# Patient Record
Sex: Male | Born: 1940 | Race: White | Hispanic: No | Marital: Married | Smoking: Never smoker
Health system: Southern US, Community
[De-identification: ages and names within clinical notes are randomized; demographics above are authoritative.]

## PROBLEM LIST (undated history)

## (undated) DIAGNOSIS — C801 Malignant (primary) neoplasm, unspecified: Secondary | ICD-10-CM

## (undated) DIAGNOSIS — I10 Essential (primary) hypertension: Secondary | ICD-10-CM

## (undated) DIAGNOSIS — F419 Anxiety disorder, unspecified: Secondary | ICD-10-CM

## (undated) DIAGNOSIS — M1990 Unspecified osteoarthritis, unspecified site: Secondary | ICD-10-CM

## (undated) DIAGNOSIS — K219 Gastro-esophageal reflux disease without esophagitis: Secondary | ICD-10-CM

## (undated) HISTORY — PX: HERNIA REPAIR: SHX51

## (undated) HISTORY — PX: SEPTOPLASTY: SUR1290

## (undated) HISTORY — PX: KNEE SURGERY: SHX244

## (undated) HISTORY — PX: NECK SURGERY: SHX720

## (undated) HISTORY — PX: TONSILLECTOMY: SUR1361

## (undated) HISTORY — PX: ROTATOR CUFF REPAIR: SHX139

---

## 2003-09-03 ENCOUNTER — Ambulatory Visit (HOSPITAL_COMMUNITY): Admission: RE | Admit: 2003-09-03 | Discharge: 2003-09-03 | Payer: Self-pay | Admitting: Gastroenterology

## 2003-09-03 ENCOUNTER — Encounter (INDEPENDENT_AMBULATORY_CARE_PROVIDER_SITE_OTHER): Payer: Self-pay | Admitting: Specialist

## 2006-01-03 ENCOUNTER — Encounter: Admission: RE | Admit: 2006-01-03 | Discharge: 2006-01-03 | Payer: Self-pay | Admitting: Internal Medicine

## 2006-09-02 ENCOUNTER — Emergency Department (HOSPITAL_COMMUNITY): Admission: EM | Admit: 2006-09-02 | Discharge: 2006-09-02 | Payer: Self-pay | Admitting: Emergency Medicine

## 2007-04-10 ENCOUNTER — Ambulatory Visit (HOSPITAL_COMMUNITY): Admission: RE | Admit: 2007-04-10 | Discharge: 2007-04-10 | Payer: Self-pay | Admitting: Internal Medicine

## 2007-10-16 ENCOUNTER — Inpatient Hospital Stay (HOSPITAL_COMMUNITY): Admission: RE | Admit: 2007-10-16 | Discharge: 2007-10-19 | Payer: Self-pay | Admitting: Orthopedic Surgery

## 2008-07-19 ENCOUNTER — Encounter: Admission: RE | Admit: 2008-07-19 | Discharge: 2008-07-19 | Payer: Self-pay | Admitting: Internal Medicine

## 2009-01-16 IMAGING — CR DG CHEST 2V
2 series · 2 of 2 positions shown · non-contrast
Comparison: None.

CLINICAL DATA: Preoperative respiratory film.
 CHEST - 2 VIEW:

[w chest pa *]
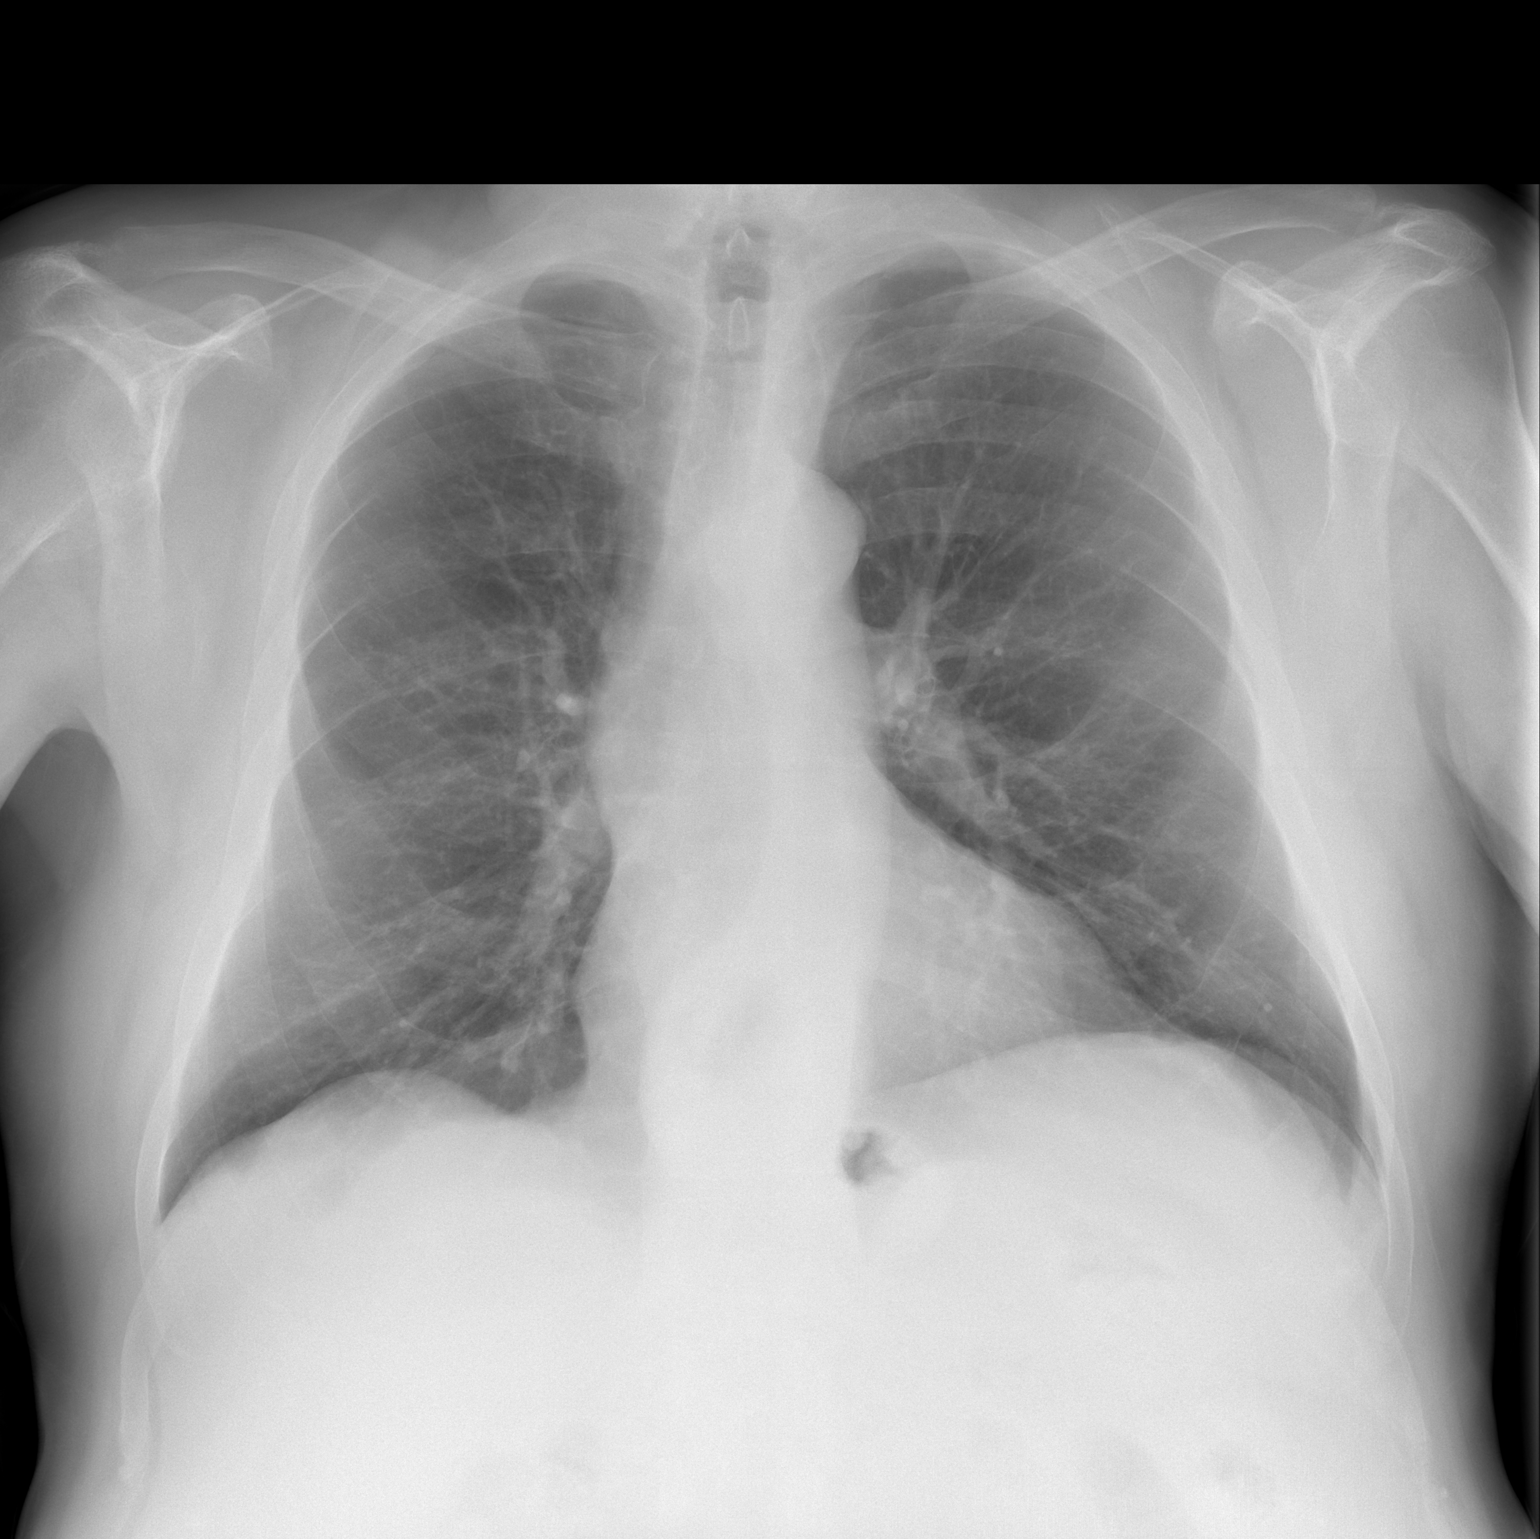

[w chest lat]
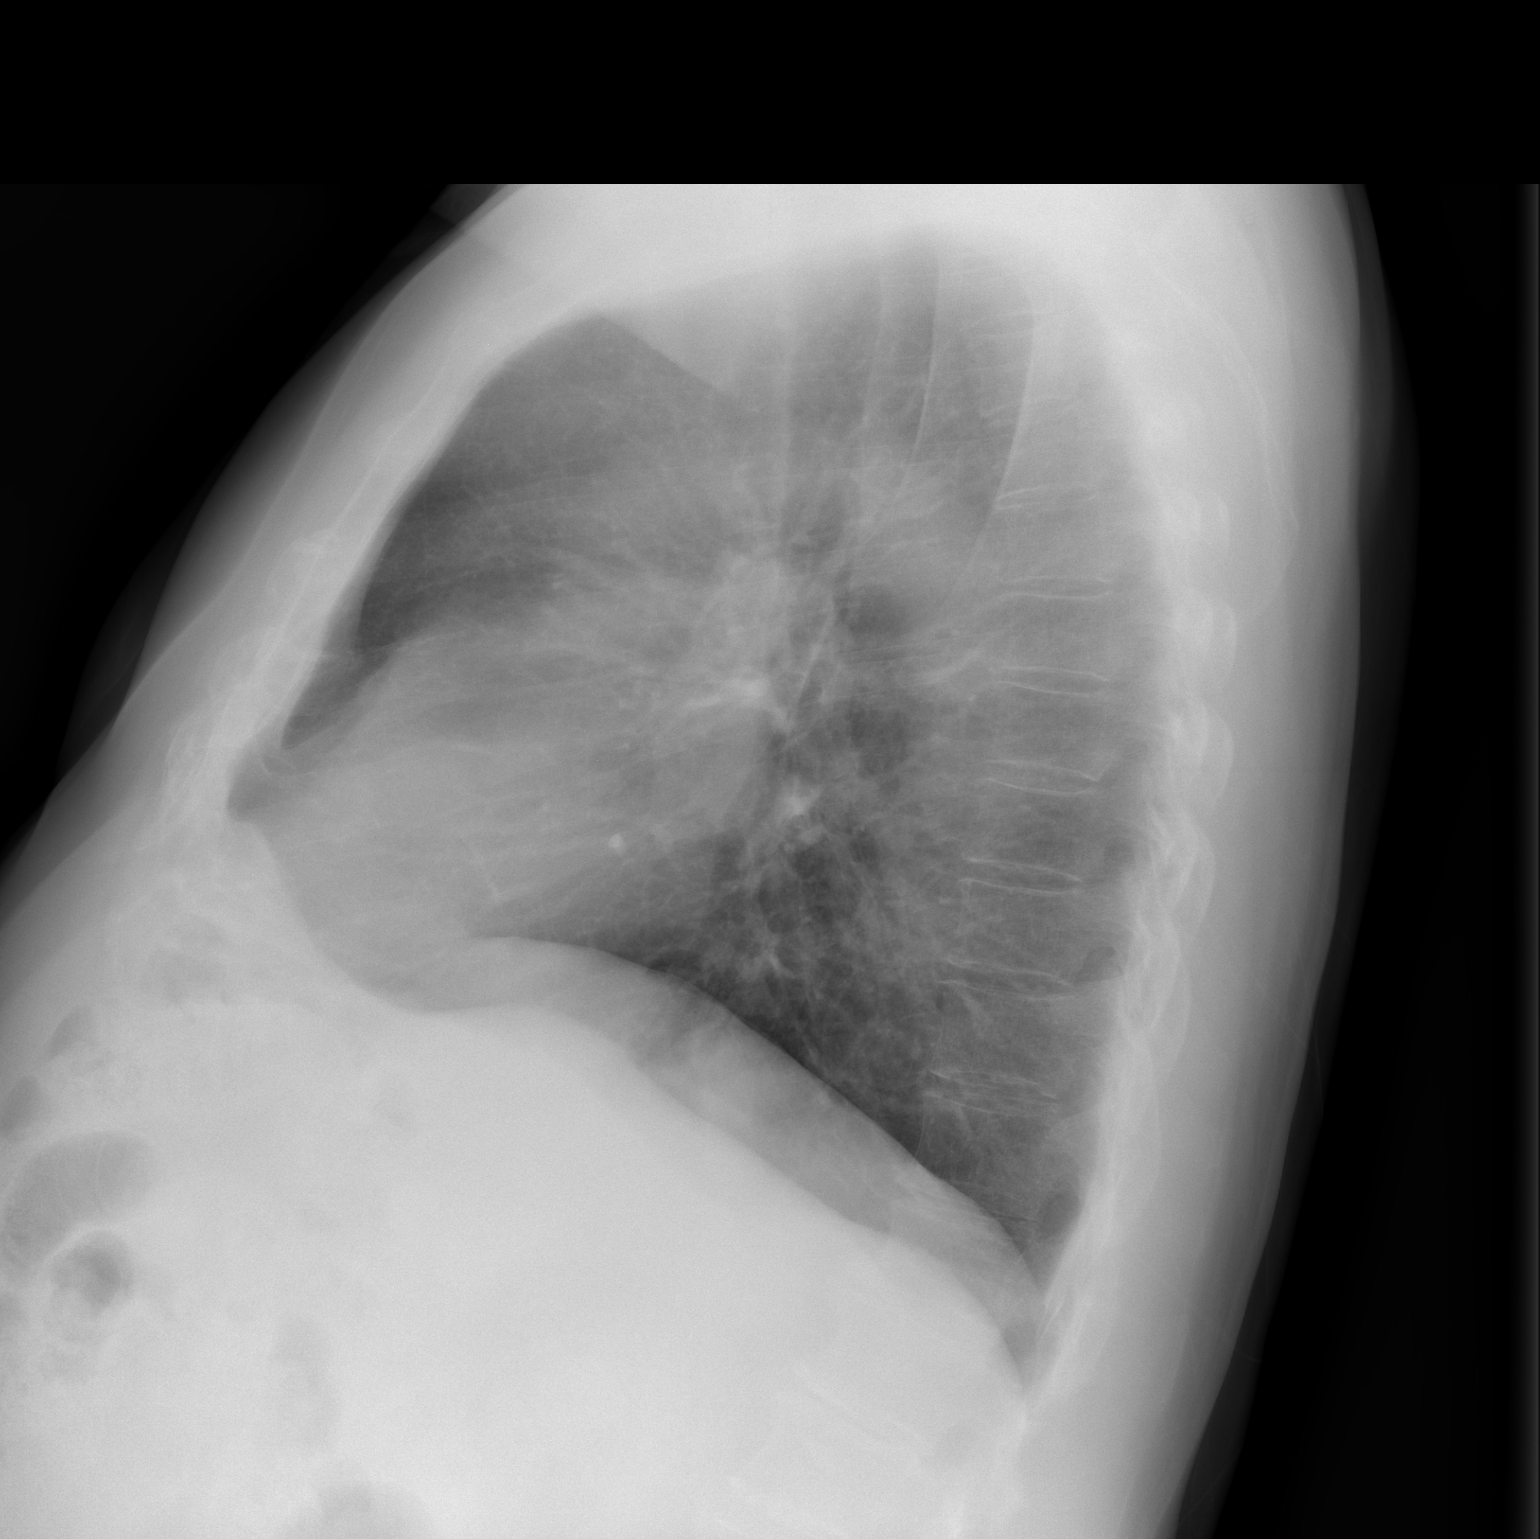

[2 of 2 positions shown; findings below may reference images not displayed]

FINDINGS: The lungs are clear. Heart size is normal. No pleural effusion or focal bony abnormality.
IMPRESSION: No acute disease.

## 2009-12-13 ENCOUNTER — Encounter: Admission: RE | Admit: 2009-12-13 | Discharge: 2009-12-13 | Payer: Self-pay | Admitting: Internal Medicine

## 2010-01-31 ENCOUNTER — Encounter: Admission: RE | Admit: 2010-01-31 | Discharge: 2010-01-31 | Payer: Self-pay | Admitting: Internal Medicine

## 2010-08-08 ENCOUNTER — Encounter: Admission: RE | Admit: 2010-08-08 | Discharge: 2010-08-08 | Payer: Self-pay | Admitting: Internal Medicine

## 2011-02-05 ENCOUNTER — Other Ambulatory Visit: Payer: Self-pay | Admitting: Internal Medicine

## 2011-02-08 ENCOUNTER — Ambulatory Visit
Admission: RE | Admit: 2011-02-08 | Discharge: 2011-02-08 | Disposition: A | Discharge status: Home / Self Care | Payer: Medicare Other | Source: Ambulatory Visit | Attending: Internal Medicine | Admitting: Internal Medicine

## 2011-02-09 ENCOUNTER — Other Ambulatory Visit: Payer: Self-pay

## 2011-02-21 ENCOUNTER — Ambulatory Visit
Admission: RE | Admit: 2011-02-21 | Discharge: 2011-02-21 | Disposition: A | Discharge status: Home / Self Care | Payer: Medicare Other | Source: Ambulatory Visit | Attending: Family Medicine | Admitting: Family Medicine

## 2011-02-21 ENCOUNTER — Other Ambulatory Visit: Payer: Self-pay | Admitting: Family Medicine

## 2011-02-21 DIAGNOSIS — J329 Chronic sinusitis, unspecified: Secondary | ICD-10-CM

## 2011-05-08 NOTE — Op Note (Signed)
NAME:  Nicholas Stuart, Nicholas Stuart NO.:  [id number]   MEDICAL RECORD NO.:  [id number]          PATIENT TYPE:  INP   LOCATION:  0003                         FACILITY:  [facility]   PHYSICIAN:  Madlyn Frankel. Charlann Boxer, M.D.  DATE OF BIRTH:  Feb 04, 1941   DATE OF PROCEDURE:  10/16/2007  DATE OF DISCHARGE:                               OPERATIVE REPORT   PREOPERATIVE DIAGNOSIS:  Left knee osteoarthritis.   POSTOPERATIVE DIAGNOSIS:  Left knee osteoarthritis.   PROCEDURE:  Left total knee replacement.   COMPONENTS USED:  DePuy rotating platform, posterior stabilized knee  system, size 5 femur, 4 tibia, 10 mm x 5 insert, a 38 patellar button.   SURGEON:  Madlyn Frankel. Charlann Boxer, M.D.   ASSISTANT:  Yetta Glassman. Mann, PA.   ANESTHESIA:  General plus a postoperative femoral nerve block ordered.   DRAIN:  One.   TOURNIQUET TIME:  47 minutes 250 mmHg.   COMPLICATIONS:  None.   INDICATIONS FOR PROCEDURE:  Nicholas Stuart is a 70 year old gentleman who I  have been following conservatively for left knee osteoarthritis and a  worker's comp plan.  He had failed all conservative measures including  arthroscopic surgery, injections, viscus augmentation.  Given the  failure, persistent discomfort, inability to do his normal work as well  as a decreased quality life, we discussed knee replacement options.  Risks and benefits were discussed and consent obtained for left total  knee replacement.   PROCEDURE IN DETAIL:  The patient was brought to operative theater.  Once adequate anesthesia and preoperative antibiotics, Ancef,  administered, the patient was positioned in the supine position with a  left proximal thigh tourniquet placed.  The left lower extremity was  prescrubbed and prepped and draped in sterile fashion.  Midline incision  was made followed by median parapatellar arthrotomy with patella  subluxation rather than eversion.  Following the initial debridement of  partial meniscectomies, attention  was first directed to the patella,  precut measure was 25 mm.  I resected down to 14 mm and used a 38  patella button.  A metal shim was placed, I did debride some of the  lateral facet to make sure there was no bony contact.  At this point,  attention was directed to the femur.  The femoral canal was opened with  a drill, irrigated to prevent fat emboli.  Intramedullary rod was passed  and at 5 degrees of valgus I resected 10 mm of bone off the distal  femur.  I sized the femur in AP dimension to be a size 5, even though in  the medial and lateral dimension it may have been closer to 4.  There  was no way to make this a size 4 without notching the femur or elevating  the joint line and potentially destabilizing my flexion gap.  The  anterior, posterior and chamfer cuts were then made off the posterior  condylar axis which matched that of the epicondylar axis and was  perpendicular to Whiteside's line.  Following this, the final box cut  was made up the distal aspect a little lateral femur.  At this point,  attention was directed now to the tibia with tibia subluxation.  I was  able to remove the remaining meniscus, PCL stump.  With the  extramedullary rod I resected 10 mm of bone off the lateral proximal  tibia into the more normal side.  Following this, I sized the tibia cut  to be a size 4.  I checked with an alignment rod and was happy that my  cut was perpendicular in both planes through the center of the ankle.  I  went ahead and drilled and keel punched and then performed a trial  reduction with a size 5 femur, 4 tibia and a 10-mm insert.  The knee  came out to full extension and was stable from extension to flexion.  The patella tracked through the trochlea without application of pressure  and with no some subluxation laterally or tilting.  At this point, all  trial components were removed, the knee was irrigated with normal saline  solution.  I injected the knee with 25% Marcaine  with epinephrine and 1  mL of Toradol total 61 mL.  The cement was mixed on the back table.  Components were then cemented into position and the knee brought out to  extension with a trial 10 in place.  Extruded cement was removed.  Once  the cement had cured, the knee was brought to flexion and the remaining  cement was removed.  Once I was satisfied that I was unable to visualize  any further cement debris, I placed a final 10-mm insert, size 5 to  match the femur, 5 mL of FloSeal was injected in the posterior capsule  tissues medially and laterally.   The knee was brought to extension.  The medium Hemovac drain was placed  deep.  The knee was then brought to flexion and closes the extensor  mechanism with #1 Vicryl in flexion.  The remainder of the  wound was  closed 2-0 Vicryl and running 4-0 Monocryl.  The knee was cleaned, dried  and dressed sterilely with the sterile and Steri-Strips and a bulky  sterile wrap.  He was brought to the recovery room extubated in stable  condition.      Madlyn Frankel Charlann Boxer, M.D.  Electronically Signed     MDO/MEDQ  D:  10/16/2007  T:  10/17/2007  Job:  [id number]

## 2011-05-08 NOTE — H&P (Signed)
NAME:  Nicholas Stuart, ADVANI NO.:  [id number]   MEDICAL RECORD NO.:  [id number]          PATIENT TYPE:  INP   LOCATION:  NA                           FACILITY:  [facility]   PHYSICIAN:  Madlyn Frankel. Charlann Boxer, M.D.  DATE OF BIRTH:  [birth year]   DATE OF ADMISSION:  10/16/2007  DATE OF DISCHARGE:                              HISTORY & PHYSICAL   PROCEDURE:  Left total knee arthroplasty.   CHIEF COMPLAINT:  Left knee pain.   HISTORY OF PRESENT ILLNESS:  A 70 year old male with a history of  persistent and progressive left knee pain secondary to osteoarthritis.  It has been refractory to all conservative treatment including oral anti-  inflammatories, cortisone injection, Visco series injections, and  arthroscopic debridement.  He has diminished quality of life,  intractable pain.  He is scheduled for left total knee replacement.  He  is to be presurgically assessed by Dr. Nila Nephew.   PAST MEDICAL HISTORY:  Significant for:  1. Osteoarthritis.  2. Hypertension.  3. Reflux disease.  4. Migraine headaches.   PAST SURGICAL HISTORY:  Includes:  1. Right knee surgery in 1983.  2. Bunion removal in 1985.  3. Left knee scope in 2005.   FAMILY HISTORY:  None.   SOCIAL HISTORY:  Married.  Primary caregiver after surgery will be  spouse.   DRUG ALLERGIES:  SULFA DRUGS.   MEDICATIONS:  1. Finasteride 5 mg one p.o. daily.  2. Bupropion XL 300 mg one p.o. daily.  3. Flomax 0.4 mg p.o. daily.  4. Hydrochlorothiazide 25 mg one p.o. daily.  5. Lisinopril 40 mg one p.o. daily.  6. Prednisone 10 mg one p.o. daily.   REVIEW OF SYSTEMS:  GASTROINTESTINAL:  History of peptic ulcers.  GENITOURINARY:  Has some urinary frequency and trouble with excessive  urinating at night.  ORTHOPEDIC:  Has taken prednisone for his arthritis  in the past.  Otherwise, see HPI.   PHYSICAL EXAMINATION:  Pulse 76, respirations 18, blood pressure 116/64.  GENERAL:  This is an awake, alert and oriented,  well-developed, well-  nourished male in no acute distress.  NECK:  Supple.  No carotid bruits.  CHEST:  Lungs clear to auscultation bilaterally.  BREASTS:  Deferred.  HEART:  Regular rate and rhythm without gallops, clicks, rubs or  murmurs.  ABDOMEN:  Soft, nontender, nondistended.  Bowel sounds present.  GENITOURINARY:  Deferred.  EXTREMITIES:  Left knee comes out to full extension with flexion to 120  degrees plus.  SKIN:  No cellulitis.  NEUROLOGIC:  Intact distal sensibilities.   LABORATORIES:  EKG, chest x-ray pending presurgical testing at [facility].   IMPRESSION:  1. Osteoarthritis.  2. Hypertension.  3. Gastroesophageal reflux disease.  4. Migraine headaches.   PLAN OF ACTION:  Left total knee arthroplasty by surgeon Dr. Durene Romans.  Risks and complications were discussed.   Postoperative medications including Lovenox, Robaxin, iron, aspirin,  Colace, MiraLax provided at time of history of physical.  Pain  medications will be provided at time of surgery.     ______________________________  Yetta Glassman [address], [place]  Madlyn Frankel Charlann Boxer, M.D.  Electronically Signed    BLM/MEDQ  D:  10/03/2007  T:  10/03/2007  Job:  [id number]   cc:   Erskine Speed, M.D.  Fax: [phone]

## 2011-05-11 NOTE — Op Note (Signed)
   NAME:  Nicholas Stuart, Nicholas Stuart NO.:  [id number]   MEDICAL RECORD NO.:  [id number]                   PATIENT TYPE:  AMB   LOCATION:  ENDO                                 FACILITY:  [facility]   PHYSICIAN:  John C. Madilyn Fireman, M.D.                 DATE OF BIRTH:  September 11, 1941   DATE OF PROCEDURE:  09/03/2003  DATE OF DISCHARGE:                                 OPERATIVE REPORT   OPERATION:  Colonoscopy with polypectomy.   INDICATIONS FOR PROCEDURE:  Colon cancer screening in a 70 year old patient  with no recent screening.   PROCEDURE:  The patient was placed in the left lateral decubitus position  and placed on the pulse monitor with continuous low flow oxygen delivered by  nasal cannula.  He was sedated with 80 mcg IV Fentanyl and 8 mg IV Versed.  The Olympus video colonoscope was inserted into the rectum and advanced to  the cecum, confirmed by transillumination of McBurney's point and  visualization of the ileocecal valve and appendiceal orifice.  The prep was  good.  The cecum, ascending, transverse, descending, and sigmoid colon all  appeared normal with no masses, polyps, diverticula or other mucosal  abnormalities.  In the rectum, there were two small polyps, approximately 6  and 8 mm in diameter, both of these were fulgurated by hot biopsy.  The  remainder of the rectum appeared normal.  The retroflex view of the anus  revealed no obvious internal hemorrhoids.  The scope was then withdrawn and  the patient returned to the recovery room in stable condition.  He tolerated  the procedure well and there were no immediate complications.   IMPRESSION:  Two small rectal polyps, otherwise, normal study.   PLAN:  Await histology to determine the method and intervals of future colon  screening.                                               John C. Madilyn Fireman, M.D.    JCH/MEDQ  D:  09/03/2003  T:  09/03/2003  Job:  [id number]   cc:   Erskine Speed, M.D.  [address],  [address]  [place]  [place] [address]  Fax: [phone]

## 2011-05-11 NOTE — Discharge Summary (Signed)
NAME:  Nicholas Stuart, Nicholas Stuart NO.:  [id number]   MEDICAL RECORD NO.:  [id number]          PATIENT TYPE:  INP   LOCATION:  1603                         FACILITY:  [facility]   PHYSICIAN:  Madlyn Frankel. Charlann Boxer, M.D.  DATE OF BIRTH:  12-18-1941   DATE OF ADMISSION:  10/16/2007  DATE OF DISCHARGE:  10/19/2007                               DISCHARGE SUMMARY   ADMITTING DIAGNOSES:  1. Osteoarthritis.  2. Hypertension.  3. Reflux disease.  4. Migraine headaches.   DISCHARGE DIAGNOSES:  1. Osteoarthritis.  2. Hypertension.  3. Reflux disease.  4. Migraine headaches.   CONSULTATION:  None.   PROCEDURE:  Right total knee replacement.   SURGEON:  Dr. Durene Romans.   ASSISTANT:  Dwyane Luo.   BRIEF HISTORY OF PRESENT ILLNESS:  A 70 year old male with a history of  persistent progressive left knee pain, secondary to osteoarthritis.  Refractory to all conservative treatments.   LABS:  Pre-admission hematocrit 39.3, remained stable, at discharge was  32.  Chemistries all within normal limits, at discharge all within  normal limits.  UA negative.  Chest X-Ray:  No active disease.  Coags  negative.   Cardiology:  EKG normal sinus rhythm from April 2008.   HOSPITAL COURSE:  The patient underwent a right total knee replacement,  tolerated procedure well, was admitted to orthopedic floor.  Remained  afebrile throughout his of course stay.  Dressing was changed on daily  basis after post-op day #1, no drainage from knee.  He was weightbearing  as tolerated with the use of rolling walker, per PT/OT.  DVT  prophylaxis, Lovenox, started on post-op day #1.  He progressed nicely  during his course of stay.  By day 3, he was stable, in improved  condition, ready for discharge.   DISCHARGE DISPOSITION:  Discharge stable, in improved condition.   DISCHARGE DIET:  No restrictions.   DISCHARGE WOUND CARE:  Keep dry, change dressing daily.   DISCHARGE PHYSICAL THERAPY:  Weightbearing as  tolerated with the use of  rolling walker for 2 weeks, then transition to single-point cane.   DISCHARGE FOLLOWUP:  Follow up with Dr. Charlann Boxer at phone number [phone],  in two weeks.   DISCHARGE MEDICATIONS:  1. Norco 5/325 one to two p.o. q.4-6 p.r.n. pain.  2. Lovenox 40 mg subcu q.24 x11 days.  3. Robaxin 500 mg p.o. daily q.6 p.r.n. muscle spasm pain.  4. Iron 325 mg p.o. t.i.d. x3 weeks.  5. Aspirin 325 mg one p.o. daily x4 weeks after Lovenox completed.  6. Colace 100 mg p.o. b.i.d.  7. MiraLax 17 grams p.o. daily.  8. Prednisone 10 mg p.o. q. with breakfast.  9. Caltrate 600 mg p.o. b.i.d.  10.Prilosec q.h.s.  11.Lisinopril 40 mg one p.o. q.h.s.  12.Bupropion XL 300 mg one p.o. q.h.s.  13.Flomax 0.4 mg one p.o. q.h.s.  14.HCTZ 25 mg one p.o. q.h.s..     ______________________________  Yetta Glassman [address], [place]      Madlyn Frankel. Charlann Boxer, M.D.  Electronically Signed    BLM/MEDQ  D:  11/04/2007  T:  11/04/2007  Job:  [id number]   cc:   Erskine Speed, M.D.  Fax: [phone]   (fax copy)

## 2011-10-03 LAB — COMPREHENSIVE METABOLIC PANEL
AST: 20
Alkaline Phosphatase: 34 — ABNORMAL LOW
BUN: 15
CO2: 29
Calcium: 9.5
Chloride: 100
Creatinine, Ser: 0.92
GFR calc Af Amer: >60
GFR calc non Af Amer: >60
Potassium: 3.9
Sodium: 137

## 2011-10-03 LAB — TYPE AND SCREEN: Antibody Screen: NEGATIVE

## 2011-10-03 LAB — BASIC METABOLIC PANEL
BUN: 11
BUN: 9
Calcium: 9
Chloride: 107
Creatinine, Ser: 0.92
GFR calc Af Amer: >60
GFR calc Af Amer: >60
GFR calc non Af Amer: >60
GFR calc non Af Amer: >60
Potassium: 3.6
Potassium: 4.3
Sodium: 142

## 2011-10-03 LAB — CBC
HCT: 32 — ABNORMAL LOW
HCT: 32.8 — ABNORMAL LOW
HCT: 39.3
Hemoglobin: 11.3 — ABNORMAL LOW
MCHC: 34.3
MCV: 91.6
Platelets: 193
RBC: 3.5 — ABNORMAL LOW
RDW: 13.7
WBC: 9

## 2011-10-03 LAB — URINALYSIS, ROUTINE W REFLEX MICROSCOPIC
Bilirubin Urine: NEGATIVE
Hgb urine dipstick: NEGATIVE
Specific Gravity, Urine: 1.016
pH: 6.5

## 2011-10-03 LAB — ABO/RH: ABO/RH(D): O POS

## 2011-10-03 LAB — APTT: aPTT: 25

## 2011-10-03 LAB — PROTIME-INR: INR: 1

## 2012-08-05 ENCOUNTER — Ambulatory Visit (HOSPITAL_COMMUNITY)
Admission: RE | Admit: 2012-08-05 | Discharge: 2012-08-05 | Disposition: A | Discharge status: Home / Self Care | Payer: Medicare Other | Source: Ambulatory Visit | Attending: Internal Medicine | Admitting: Internal Medicine

## 2012-08-05 DIAGNOSIS — R079 Chest pain, unspecified: Secondary | ICD-10-CM

## 2014-01-11 ENCOUNTER — Other Ambulatory Visit: Payer: Self-pay | Admitting: Obstetrics and Gynecology

## 2014-02-17 ENCOUNTER — Other Ambulatory Visit: Payer: Self-pay | Admitting: Internal Medicine

## 2014-02-17 DIAGNOSIS — M810 Age-related osteoporosis without current pathological fracture: Secondary | ICD-10-CM

## 2014-03-08 ENCOUNTER — Ambulatory Visit
Admission: RE | Admit: 2014-03-08 | Discharge: 2014-03-08 | Disposition: A | Discharge status: Home / Self Care | Payer: Medicare Other | Source: Ambulatory Visit | Attending: Internal Medicine | Admitting: Internal Medicine

## 2014-03-08 DIAGNOSIS — M810 Age-related osteoporosis without current pathological fracture: Secondary | ICD-10-CM

## 2014-03-25 ENCOUNTER — Ambulatory Visit: Payer: Self-pay

## 2014-03-25 ENCOUNTER — Other Ambulatory Visit: Payer: Self-pay | Admitting: Occupational Medicine

## 2014-03-25 DIAGNOSIS — M25512 Pain in left shoulder: Secondary | ICD-10-CM

## 2014-07-16 ENCOUNTER — Encounter (HOSPITAL_COMMUNITY): Payer: Self-pay | Admitting: Emergency Medicine

## 2014-07-16 ENCOUNTER — Emergency Department (HOSPITAL_COMMUNITY)
Admission: EM | Admit: 2014-07-16 | Discharge: 2014-07-16 | Disposition: A | Discharge status: Home / Self Care | Payer: Medicare Other | Attending: Emergency Medicine | Admitting: Emergency Medicine

## 2014-07-16 DIAGNOSIS — I10 Essential (primary) hypertension: Secondary | ICD-10-CM | POA: Insufficient documentation

## 2014-07-16 DIAGNOSIS — N489 Disorder of penis, unspecified: Secondary | ICD-10-CM | POA: Insufficient documentation

## 2014-07-16 DIAGNOSIS — R339 Retention of urine, unspecified: Secondary | ICD-10-CM | POA: Insufficient documentation

## 2014-07-16 DIAGNOSIS — Z79899 Other long term (current) drug therapy: Secondary | ICD-10-CM | POA: Diagnosis not present

## 2014-07-16 DIAGNOSIS — K219 Gastro-esophageal reflux disease without esophagitis: Secondary | ICD-10-CM | POA: Diagnosis not present

## 2014-07-16 DIAGNOSIS — F411 Generalized anxiety disorder: Secondary | ICD-10-CM | POA: Diagnosis not present

## 2014-07-16 DIAGNOSIS — IMO0002 Reserved for concepts with insufficient information to code with codable children: Secondary | ICD-10-CM | POA: Diagnosis not present

## 2014-07-16 DIAGNOSIS — R109 Unspecified abdominal pain: Secondary | ICD-10-CM | POA: Insufficient documentation

## 2014-07-16 DIAGNOSIS — R338 Other retention of urine: Secondary | ICD-10-CM

## 2014-07-16 HISTORY — DX: Gastro-esophageal reflux disease without esophagitis: K21.9

## 2014-07-16 HISTORY — DX: Anxiety disorder, unspecified: F41.9

## 2014-07-16 HISTORY — DX: Essential (primary) hypertension: I10

## 2014-07-16 LAB — URINALYSIS, ROUTINE W REFLEX MICROSCOPIC
Bilirubin Urine: NEGATIVE
Glucose, UA: NEGATIVE mg/dL
Hgb urine dipstick: NEGATIVE
Ketones, ur: NEGATIVE mg/dL
Leukocytes, UA: NEGATIVE
NITRITE: NEGATIVE
PROTEIN: NEGATIVE mg/dL
SPECIFIC GRAVITY, URINE: 1.016 (ref 1.005–1.030)
UROBILINOGEN UA: 1 mg/dL (ref 0.0–1.0)
pH: 6 (ref 5.0–8.0)

## 2014-07-16 LAB — BASIC METABOLIC PANEL
Anion gap: 12 (ref 5–15)
BUN: 27 mg/dL — AB (ref 6–23)
CALCIUM: 9.2 mg/dL (ref 8.4–10.5)
CO2: 26 mEq/L (ref 19–32)
Chloride: 99 mEq/L (ref 96–112)
Creatinine, Ser: 0.94 mg/dL (ref 0.50–1.35)
GFR, EST NON AFRICAN AMERICAN: 80 mL/min — AB (ref >90)
Glucose, Bld: 172 mg/dL — ABNORMAL HIGH (ref 70–99)
POTASSIUM: 3.9 meq/L (ref 3.7–5.3)
SODIUM: 137 meq/L (ref 137–147)

## 2014-07-16 NOTE — ED Notes (Signed)
MD at bedside. 

## 2014-07-16 NOTE — Discharge Instructions (Signed)
Acute Urinary Retention °Acute urinary retention is the temporary inability to urinate. °This is a common problem in older men. As men age their prostates become larger and block the flow of urine from the bladder. This is usually a problem that has come on gradually.  °HOME CARE INSTRUCTIONS °If you are sent home with a Foley catheter and a drainage system, you will need to discuss the best course of action with your health care provider. While the catheter is in, maintain a good intake of fluids. Keep the drainage bag emptied and lower than your catheter. This is so that contaminated urine will not flow back into your bladder, which could lead to a urinary tract infection. °There are two main types of drainage bags. One is a large bag that usually is used at night. It has a good capacity that will allow you to sleep through the night without having to empty it. The second type is called a leg bag. It has a smaller capacity, so it needs to be emptied more frequently. However, the main advantage is that it can be attached by a leg strap and can go underneath your clothing, allowing you the freedom to move about or leave your home. °Only take over-the-counter or prescription medicines for pain, discomfort, or fever as directed by your health care provider.  °SEEK MEDICAL CARE IF: °· You develop a low-grade fever. °· You experience spasms or leakage of urine with the spasms. °SEEK IMMEDIATE MEDICAL CARE IF:  °· You develop chills or fever. °· Your catheter stops draining urine. °· Your catheter falls out. °· You start to develop increased bleeding that does not respond to rest and increased fluid intake. °MAKE SURE YOU: °· Understand these instructions. °· Will watch your condition. °· Will get help right away if you are not doing well or get worse. °Document Released: 03/18/2001 Document Revised: 12/15/2013 Document Reviewed: 05/21/2013 °Exit[employer]® Patient Information ©2015 [employer]. This information is not  intended to replace advice given to you by your health care provider. Make sure you discuss any questions you have with your health care provider. ° °

## 2014-07-16 NOTE — ED Provider Notes (Signed)
CSN: [id number]     Arrival date & time 07/16/14  1600 History   First MD Initiated Contact with Patient 07/16/14 1616     Chief Complaint  Patient presents with  . Urinary Retention     (Consider location/radiation/quality/duration/timing/severity/associated sxs/prior Treatment) HPI Comments: Acute urinary retention with suprapubic pain. On percocet post shoulder surgery 4 days ago. Noticing increasingly worsening difficulty urinating for past 3 days. Patient here with suprapubic pain, bladder scan >1000cc of urine. Sent from PCP's office.  Patient is a 73 y.o. male presenting with male genitourinary complaint. The history is provided by the patient.  Male GU Problem Presenting symptoms: penile pain   Context comment:  Surgery Monday, having some stinging sensation in his urine Relieved by:  Nothing Worsened by:  Nothing tried Ineffective treatments:  None tried Associated symptoms: abdominal pain (lower) and urinary retention   Associated symptoms: no diarrhea, no fever, no nausea and no vomiting     Past Medical History  Diagnosis Date  . Anxiety   . GERD (gastroesophageal reflux disease)   . Hypertension    Past Surgical History  Procedure Laterality Date  . Tonsillectomy    . Hernia repair      1989  . Neck surgery      1983  . Knee surgery      left knee, 2008  . Rotator cuff repair      left 07/12/14   History reviewed. No pertinent family history. History  Substance Use Topics  . Smoking status: Never Smoker   . Smokeless tobacco: Not on file  . Alcohol Use: No    Review of Systems  Constitutional: Negative for fever.  Gastrointestinal: Positive for abdominal pain (lower). Negative for nausea, vomiting and diarrhea.  Genitourinary: Positive for penile pain.  All other systems reviewed and are negative.     Allergies  Codeine and Sulfa antibiotics  Home Medications   Prior to Admission medications   Medication Sig Start Date End Date Taking?  Authorizing Provider  buPROPion (WELLBUTRIN XL) 300 MG 24 hr tablet Take 300 mg by mouth daily.   Yes Historical Provider, MD  Calcium Carbonate (CALTRATE 600 PO) Take 1 tablet by mouth daily.   Yes Historical Provider, MD  finasteride (PROSCAR) 5 MG tablet Take 5 mg by mouth daily.   Yes Historical Provider, MD  fluticasone (FLONASE) 50 MCG/ACT nasal spray Place into both nostrils daily.   Yes Historical Provider, MD  latanoprost (XALATAN) 0.005 % ophthalmic solution Place 1 drop into both eyes at bedtime.    Yes Historical Provider, MD  lisinopril (PRINIVIL,ZESTRIL) 40 MG tablet Take 40 mg by mouth daily.   Yes Historical Provider, MD  omeprazole (PRILOSEC) 20 MG capsule Take 20 mg by mouth daily.   Yes Historical Provider, MD  oxyCODONE-acetaminophen (PERCOCET/ROXICET) 5-325 MG per tablet Take 1-2 tablets by mouth every 4 (four) hours as needed for severe pain.    Yes Historical Provider, MD  predniSONE (DELTASONE) 2.5 MG tablet Take 2.5 mg by mouth daily with breakfast. For arthritis in hands.   Yes Historical Provider, MD  tamsulosin (FLOMAX) 0.4 MG CAPS capsule Take 0.4 mg by mouth at bedtime.   Yes Historical Provider, MD   BP 135/73  Pulse 94  Temp(Src) 98.2 F (36.8 C) (Oral)  Resp 16  SpO2 97% Physical Exam  Constitutional: He is oriented to person, place, and time. He appears well-developed and well-nourished. No distress.  HENT:  Head: Normocephalic and atraumatic.  Mouth/Throat: Oropharynx is  clear and moist. No oropharyngeal exudate.  Eyes: EOM are normal. Pupils are equal, round, and reactive to light.  Neck: Normal range of motion. Neck supple.  Cardiovascular: Normal rate and regular rhythm.  Exam reveals no friction rub.   No murmur heard. Pulmonary/Chest: Effort normal and breath sounds normal. No respiratory distress. He has no wheezes. He has no rales.  Abdominal: Soft. He exhibits no distension. There is tenderness (suprapubic). There is no rebound.   Musculoskeletal: Normal range of motion. He exhibits no edema.  Neurological: He is alert and oriented to person, place, and time.  Skin: He is not diaphoretic.    ED Course  Procedures (including critical care time) Labs Review Labs Reviewed  URINALYSIS, ROUTINE W REFLEX MICROSCOPIC    Imaging Review No results found.   EKG Interpretation None      MDM   Final diagnoses:  Acute urinary retention    46M presents with urinary retention. Had shoulder arthroscopy on Monday, is on percocet. Having only a few drops of urine at a time. Cath with over 1100 cc of urine output. No UTI. Stable for discharge with leg bag. Normal creatinine.    Osvaldo Shipper, MD 07/16/14 2220

## 2014-07-16 NOTE — ED Notes (Signed)
Pt had left rotator cuff surgery on Monday 07/12/14. Pt has had urinary retention, difficulty voiding since 7/21. Pt had bladder ultrasound done today. Sent to ED with note saying he had "bladder obstruction, 1022ml in bladder, and UTI".

## 2016-02-20 DIAGNOSIS — Z0000 Encounter for general adult medical examination without abnormal findings: Secondary | ICD-10-CM | POA: Diagnosis not present

## 2016-02-20 DIAGNOSIS — E039 Hypothyroidism, unspecified: Secondary | ICD-10-CM | POA: Diagnosis not present

## 2016-02-20 DIAGNOSIS — D559 Anemia due to enzyme disorder, unspecified: Secondary | ICD-10-CM | POA: Diagnosis not present

## 2016-02-20 DIAGNOSIS — N400 Enlarged prostate without lower urinary tract symptoms: Secondary | ICD-10-CM | POA: Diagnosis not present

## 2016-02-20 DIAGNOSIS — I10 Essential (primary) hypertension: Secondary | ICD-10-CM | POA: Diagnosis not present

## 2016-02-20 DIAGNOSIS — M1990 Unspecified osteoarthritis, unspecified site: Secondary | ICD-10-CM | POA: Diagnosis not present

## 2016-02-23 DIAGNOSIS — C44310 Basal cell carcinoma of skin of unspecified parts of face: Secondary | ICD-10-CM | POA: Diagnosis not present

## 2016-02-23 DIAGNOSIS — L738 Other specified follicular disorders: Secondary | ICD-10-CM | POA: Diagnosis not present

## 2016-03-26 DIAGNOSIS — H8149 Vertigo of central origin, unspecified ear: Secondary | ICD-10-CM | POA: Diagnosis not present

## 2016-03-26 DIAGNOSIS — C44310 Basal cell carcinoma of skin of unspecified parts of face: Secondary | ICD-10-CM | POA: Diagnosis not present

## 2016-04-17 DIAGNOSIS — H401132 Primary open-angle glaucoma, bilateral, moderate stage: Secondary | ICD-10-CM | POA: Diagnosis not present

## 2016-05-07 DIAGNOSIS — I119 Hypertensive heart disease without heart failure: Secondary | ICD-10-CM | POA: Diagnosis not present

## 2016-05-07 DIAGNOSIS — H8149 Vertigo of central origin, unspecified ear: Secondary | ICD-10-CM | POA: Diagnosis not present

## 2016-05-22 DIAGNOSIS — H401132 Primary open-angle glaucoma, bilateral, moderate stage: Secondary | ICD-10-CM | POA: Diagnosis not present

## 2016-06-20 DIAGNOSIS — H401132 Primary open-angle glaucoma, bilateral, moderate stage: Secondary | ICD-10-CM | POA: Diagnosis not present

## 2016-09-10 DIAGNOSIS — I739 Peripheral vascular disease, unspecified: Secondary | ICD-10-CM | POA: Diagnosis not present

## 2016-09-10 DIAGNOSIS — I119 Hypertensive heart disease without heart failure: Secondary | ICD-10-CM | POA: Diagnosis not present

## 2016-09-10 DIAGNOSIS — Z23 Encounter for immunization: Secondary | ICD-10-CM | POA: Diagnosis not present

## 2016-09-26 DIAGNOSIS — H401132 Primary open-angle glaucoma, bilateral, moderate stage: Secondary | ICD-10-CM | POA: Diagnosis not present

## 2017-01-01 DIAGNOSIS — H401132 Primary open-angle glaucoma, bilateral, moderate stage: Secondary | ICD-10-CM | POA: Diagnosis not present

## 2017-02-11 DIAGNOSIS — D559 Anemia due to enzyme disorder, unspecified: Secondary | ICD-10-CM | POA: Diagnosis not present

## 2017-02-11 DIAGNOSIS — S81811A Laceration without foreign body, right lower leg, initial encounter: Secondary | ICD-10-CM | POA: Diagnosis not present

## 2017-02-11 DIAGNOSIS — M1990 Unspecified osteoarthritis, unspecified site: Secondary | ICD-10-CM | POA: Diagnosis not present

## 2017-02-11 DIAGNOSIS — C4490 Unspecified malignant neoplasm of skin, unspecified: Secondary | ICD-10-CM | POA: Diagnosis not present

## 2017-02-11 DIAGNOSIS — Z125 Encounter for screening for malignant neoplasm of prostate: Secondary | ICD-10-CM | POA: Diagnosis not present

## 2017-02-11 DIAGNOSIS — K635 Polyp of colon: Secondary | ICD-10-CM | POA: Diagnosis not present

## 2017-02-11 DIAGNOSIS — I10 Essential (primary) hypertension: Secondary | ICD-10-CM | POA: Diagnosis not present

## 2017-02-11 DIAGNOSIS — N401 Enlarged prostate with lower urinary tract symptoms: Secondary | ICD-10-CM | POA: Diagnosis not present

## 2017-02-11 DIAGNOSIS — Z0000 Encounter for general adult medical examination without abnormal findings: Secondary | ICD-10-CM | POA: Diagnosis not present

## 2017-03-18 DIAGNOSIS — C44329 Squamous cell carcinoma of skin of other parts of face: Secondary | ICD-10-CM | POA: Diagnosis not present

## 2017-03-18 DIAGNOSIS — D0421 Carcinoma in situ of skin of right ear and external auricular canal: Secondary | ICD-10-CM | POA: Diagnosis not present

## 2017-03-18 DIAGNOSIS — H6120 Impacted cerumen, unspecified ear: Secondary | ICD-10-CM | POA: Diagnosis not present

## 2017-03-18 DIAGNOSIS — B078 Other viral warts: Secondary | ICD-10-CM | POA: Diagnosis not present

## 2017-03-20 DIAGNOSIS — H9313 Tinnitus, bilateral: Secondary | ICD-10-CM | POA: Diagnosis not present

## 2017-03-20 DIAGNOSIS — H903 Sensorineural hearing loss, bilateral: Secondary | ICD-10-CM | POA: Diagnosis not present

## 2017-04-01 DIAGNOSIS — L723 Sebaceous cyst: Secondary | ICD-10-CM | POA: Diagnosis not present

## 2017-04-01 DIAGNOSIS — H401132 Primary open-angle glaucoma, bilateral, moderate stage: Secondary | ICD-10-CM | POA: Diagnosis not present

## 2017-04-03 DIAGNOSIS — C4490 Unspecified malignant neoplasm of skin, unspecified: Secondary | ICD-10-CM | POA: Diagnosis not present

## 2017-04-03 DIAGNOSIS — I10 Essential (primary) hypertension: Secondary | ICD-10-CM | POA: Diagnosis not present

## 2020-01-28 ENCOUNTER — Encounter: Payer: Self-pay | Admitting: Internal Medicine

## 2020-06-30 ENCOUNTER — Other Ambulatory Visit: Payer: Self-pay | Admitting: Internal Medicine

## 2020-06-30 DIAGNOSIS — M810 Age-related osteoporosis without current pathological fracture: Secondary | ICD-10-CM

## 2020-11-24 ENCOUNTER — Emergency Department (HOSPITAL_COMMUNITY): Payer: Medicare Other

## 2020-11-24 ENCOUNTER — Encounter (HOSPITAL_COMMUNITY): Payer: Self-pay | Admitting: Emergency Medicine

## 2020-11-24 ENCOUNTER — Emergency Department (HOSPITAL_COMMUNITY)
Admission: EM | Admit: 2020-11-24 | Discharge: 2020-11-24 | Disposition: A | Discharge status: Home / Self Care | Payer: Medicare Other | Attending: Emergency Medicine | Admitting: Emergency Medicine

## 2020-11-24 ENCOUNTER — Other Ambulatory Visit: Payer: Self-pay

## 2020-11-24 DIAGNOSIS — R42 Dizziness and giddiness: Secondary | ICD-10-CM | POA: Diagnosis present

## 2020-11-24 DIAGNOSIS — R519 Headache, unspecified: Secondary | ICD-10-CM | POA: Diagnosis not present

## 2020-11-24 DIAGNOSIS — I10 Essential (primary) hypertension: Secondary | ICD-10-CM | POA: Diagnosis not present

## 2020-11-24 LAB — BASIC METABOLIC PANEL
Anion gap: 12 (ref 5–15)
BUN: 16 mg/dL (ref 8–23)
CO2: 23 mmol/L (ref 22–32)
Calcium: 9.2 mg/dL (ref 8.9–10.3)
Chloride: 105 mmol/L (ref 98–111)
Creatinine, Ser: 0.93 mg/dL (ref 0.61–1.24)
GFR, Estimated: >60 mL/min (ref >60)
Glucose, Bld: 142 mg/dL — ABNORMAL HIGH (ref 70–99)
Potassium: 3.6 mmol/L (ref 3.5–5.1)
Sodium: 140 mmol/L (ref 135–145)

## 2020-11-24 LAB — CBC
HCT: 39.9 % (ref 39.0–52.0)
Hemoglobin: 13.8 g/dL (ref 13.0–17.0)
MCH: 30.9 pg (ref 26.0–34.0)
MCHC: 34.6 g/dL (ref 30.0–36.0)
MCV: 89.3 fL (ref 80.0–100.0)
Platelets: 291 10*3/uL (ref 150–400)
RBC: 4.47 MIL/uL (ref 4.22–5.81)
RDW: 12.4 % (ref 11.5–15.5)
WBC: 6.4 10*3/uL (ref 4.0–10.5)
nRBC: 0 % (ref 0.0–0.2)

## 2020-11-24 LAB — URINALYSIS, ROUTINE W REFLEX MICROSCOPIC
Bilirubin Urine: NEGATIVE
Glucose, UA: NEGATIVE mg/dL
Hgb urine dipstick: NEGATIVE
Ketones, ur: NEGATIVE mg/dL
Leukocytes,Ua: NEGATIVE
Nitrite: NEGATIVE
Protein, ur: NEGATIVE mg/dL
Specific Gravity, Urine: 1.014 (ref 1.005–1.030)
pH: 7 (ref 5.0–8.0)

## 2020-11-24 LAB — CBG MONITORING, ED: Glucose-Capillary: 125 mg/dL — ABNORMAL HIGH (ref 70–99)

## 2020-11-24 LAB — MAGNESIUM: Magnesium: 1.9 mg/dL (ref 1.7–2.4)

## 2020-11-24 MED ORDER — MECLIZINE HCL 25 MG PO TABS: 25.0000 mg | ORAL_TABLET | Freq: Two times a day (BID) | ORAL | 0 refills | Status: DC | PRN

## 2020-11-24 MED ORDER — SODIUM CHLORIDE 0.9 % IV BOLUS
1000.0000 mL | Freq: Once | INTRAVENOUS | Status: AC
  Administered 2020-11-24: 1000 mL via INTRAVENOUS

## 2020-11-24 MED ORDER — DIPHENHYDRAMINE HCL 50 MG/ML IJ SOLN
12.5000 mg | Freq: Once | INTRAMUSCULAR | Status: AC
  Administered 2020-11-24: 12.5 mg via INTRAVENOUS
  Filled 2020-11-24: qty 1

## 2020-11-24 MED ORDER — MECLIZINE HCL 25 MG PO TABS
25.0000 mg | ORAL_TABLET | Freq: Once | ORAL | Status: AC
  Administered 2020-11-24: 25 mg via ORAL
  Filled 2020-11-24: qty 1

## 2020-11-24 MED ORDER — PROCHLORPERAZINE EDISYLATE 10 MG/2ML IJ SOLN
5.0000 mg | Freq: Once | INTRAMUSCULAR | Status: AC
  Administered 2020-11-24: 5 mg via INTRAVENOUS
  Filled 2020-11-24: qty 2

## 2020-11-24 MED ORDER — DIAZEPAM 5 MG/ML IJ SOLN
2.5000 mg | Freq: Once | INTRAMUSCULAR | Status: AC
  Administered 2020-11-24: 2.5 mg via INTRAVENOUS
  Filled 2020-11-24: qty 2

## 2020-11-24 NOTE — ED Provider Notes (Signed)
CT of head and MRI normal.  No stroke.  Dizziness has resolved and patient able to ambulate without any issues.  Suspect peripheral vertigo.  Given prescription for meclizine.  Understands return precautions.  Recommend follow-up with primary care doctor.  This chart was dictated using voice recognition software.  Despite best efforts to proofread,  errors can occur which can change the documentation meaning.     Lennice Sites, DO 11/24/20 1836

## 2020-11-24 NOTE — ED Notes (Signed)
Patient transported to CT 

## 2020-11-24 NOTE — ED Notes (Signed)
Patient verbalized understanding of discharge instructions. Opportunity for questions and answers.  

## 2020-11-24 NOTE — ED Provider Notes (Signed)
[facility] EMERGENCY DEPARTMENT Provider Note   CSN: [id number] Arrival date & time: 11/24/20  1249     History Chief Complaint  Patient presents with  . Dizziness    Nicholas Stuart is a 79 y.o. male.  79 yo M with a chief complaints of sudden onset of dizziness.  This occurred when the patient woke up about 10 AM.  The patient had woken up in the middle the night to use the bathroom without significant symptoms and then when he opened his eyes at 10 felt suddenly very dizzy.  Felt like the room was spinning around him.  Became very nauseated and vomited.  Has a frontal headache that started with the symptoms as well.  Since then the sensation has improved somewhat though reoccurs with certain positions of his head and certain movements of his eyes.  Denies one-sided numbness or weakness denies difficulty with speech or swallowing denies chest pain or pressure.  Denies feeling like he may pass out.  Has been eating and drinking normally.  No new medications.  Prior to the dizziness no vomiting or nausea.  Denied abdominal pain.  Denied head trauma.  Denies neck pain.  The history is provided by the patient.  Dizziness Quality:  Head spinning Severity:  Severe Onset quality:  Sudden Duration:  4 hours Timing:  Constant Progression:  Unchanged Chronicity:  New Relieved by:  Being still and closing eyes Worsened by:  Eye movement, movement and turning head Ineffective treatments:  None tried Associated symptoms: headaches   Associated symptoms: no chest pain, no diarrhea, no palpitations, no shortness of breath and no vomiting        Past Medical History:  Diagnosis Date  . Anxiety   . GERD (gastroesophageal reflux disease)   . Hypertension     Patient Active Problem List   Diagnosis Date Noted  . 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 HERNIA  REPAIR     1989  . KNEE SURGERY     left knee, 2008  . NECK SURGERY     1983  . ROTATOR CUFF REPAIR     left 07/12/14  . TONSILLECTOMY         No family history on file.  Social History   Tobacco Use  . Smoking status: Never Smoker  Substance Use Topics  . Alcohol use: No  . Drug use: Not on file    Home Medications Prior to Admission medications   Medication Sig Start Date End Date Taking? Authorizing Provider  buPROPion (WELLBUTRIN XL) 300 MG 24 hr tablet Take 300 mg by mouth daily.    [provider]  Calcium Carbonate (CALTRATE 600 PO) Take 1 tablet by mouth daily.    [provider]  finasteride (PROSCAR) 5 MG tablet Take 5 mg by mouth daily.    [provider]  fluticasone (FLONASE) 50 MCG/ACT nasal spray Place into both nostrils daily.    [provider]  latanoprost (XALATAN) 0.005 % ophthalmic solution Place 1 drop into both eyes at bedtime.     [provider]  lisinopril (PRINIVIL,ZESTRIL) 40 MG tablet Take 40 mg by mouth daily.    [provider]  omeprazole (PRILOSEC) 20 MG capsule Take 20 mg by mouth daily.    [provider]  oxyCODONE-acetaminophen (PERCOCET/ROXICET) 5-325 MG per tablet Take 1-2 tablets by mouth every 4 (four) hours  as needed for severe pain.     [provider]  predniSONE (DELTASONE) 2.5 MG tablet Take 2.5 mg by mouth daily with breakfast. For arthritis in hands.    [provider]  tamsulosin (FLOMAX) 0.4 MG CAPS capsule Take 0.4 mg by mouth at bedtime.    [provider]    Allergies    Codeine and Sulfa antibiotics  Review of Systems   Review of Systems  Constitutional: Negative for chills and fever.  HENT: Negative for congestion and facial swelling.   Eyes: Negative for discharge and visual disturbance.  Respiratory: Negative for shortness of breath.   Cardiovascular: Negative for chest pain and palpitations.  Gastrointestinal: Negative for  abdominal pain, diarrhea and vomiting.  Musculoskeletal: Negative for arthralgias and myalgias.  Skin: Negative for color change and rash.  Neurological: Positive for dizziness and headaches. Negative for tremors and syncope.  Psychiatric/Behavioral: Negative for confusion and dysphoric mood.    Physical Exam Updated Vital Signs BP 132/78   Pulse 60   Temp 98.6 F (37 C) (Oral)   Resp 16   Ht 5\' 8"  (1.727 m)   Wt 73.9 kg   SpO2 97%   BMI 24.78 kg/m   Physical Exam Vitals and nursing note reviewed.  Constitutional:      Appearance: He is well-developed.  HENT:     Head: Normocephalic and atraumatic.  Eyes:     Pupils: Pupils are equal, round, and reactive to light.  Neck:     Vascular: No JVD.  Cardiovascular:     Rate and Rhythm: Normal rate and regular rhythm.     Heart sounds: No murmur heard.  No friction rub. No gallop.   Pulmonary:     Effort: No respiratory distress.     Breath sounds: No wheezing.  Abdominal:     General: There is no distension.     Tenderness: There is no abdominal tenderness. There is no guarding or rebound.  Musculoskeletal:        General: Normal range of motion.     Cervical back: Normal range of motion and neck supple.  Skin:    Coloration: Skin is not pale.     Findings: No rash.  Neurological:     Mental Status: He is alert and oriented to person, place, and time.     Comments: Past-pointing with the left upper extremity though on repetitive motion was able to do finger-to-nose without issue.  Unable to ambulate due to dizziness.  Otherwise benign neurologic exam.  No appreciable nystagmus.  Psychiatric:        Behavior: Behavior normal.     ED Results / Procedures / Treatments   Labs (all labs ordered are listed, but only abnormal results are displayed) Labs Reviewed  BASIC METABOLIC PANEL - Abnormal; Notable for the following components:      Result Value   Glucose, Bld 142 (*)    All other components within normal limits   URINALYSIS, ROUTINE W REFLEX MICROSCOPIC - Abnormal; Notable for the following components:   APPearance CLOUDY (*)    All other components within normal limits  CBG MONITORING, ED - Abnormal; Notable for the following components:   Glucose-Capillary 125 (*)    All other components within normal limits  CBC  MAGNESIUM    EKG EKG Interpretation  Date/Time:  Thursday November 24 2020 13:47:34 EST Ventricular Rate:  45 PR Interval:    QRS Duration: 105 QT Interval:  492 QTC Calculation: 426 R  Axis:   -6 Text Interpretation: Sinus bradycardia No significant change since last tracing Confirmed by Deno Etienne [phone]) on 11/24/2020 2:17:16 PM   Radiology CT Head Wo Contrast  Result Date: 11/24/2020 CLINICAL DATA:  Dizziness EXAM: CT HEAD WITHOUT CONTRAST TECHNIQUE: Contiguous axial images were obtained from the base of the skull through the vertex without intravenous contrast. COMPARISON:  September 02, 2006 FINDINGS: Brain: There is age related volume loss. There is no intracranial mass, hemorrhage, extra-axial fluid collection, midline shift. There is minimal small vessel disease in the centra semiovale bilaterally. Elsewhere brain parenchyma appears unremarkable. No evident acute infarct. Vascular: No hyperdense vessel. Foci of calcification noted in each carotid siphon. Skull: Bony calvarium appears intact. Sinuses/Orbits: There is mucosal thickening and opacity in multiple ethmoid air cells. There is mucosal thickening in the anterior sphenoid sinus region with apparent retention cyst in this area. Orbits appear symmetric bilaterally. Other: Mastoid air cells are clear. IMPRESSION: Age related volume loss with minimal periventricular small vessel disease. No mass, hemorrhage, or evidence of acute appearing infarct. Foci of arterial vascular calcification noted. Multiple foci paranasal sinus disease noted. Electronically Signed   By: Lowella Grip III M.D.   On: 11/24/2020 15:03     Procedures Procedures (including critical care time)  Medications Ordered in ED Medications  sodium chloride 0.9 % bolus 1,000 mL (1,000 mLs Intravenous New Bag/Given 11/24/20 1359)  prochlorperazine (COMPAZINE) injection 5 mg (5 mg Intravenous Given 11/24/20 1400)  diphenhydrAMINE (BENADRYL) injection 12.5 mg (12.5 mg Intravenous Given 11/24/20 1400)  meclizine (ANTIVERT) tablet 25 mg (25 mg Oral Given 11/24/20 1410)    ED Course  I have reviewed the triage vital signs and the nursing notes.  Pertinent labs & imaging results that were available during my care of the patient were reviewed by me and considered in my medical decision making (see chart for details).    MDM Rules/Calculators/A&P                          79 yo M with a chief complaint of sudden onset dizziness.  Started this morning when he woke up.  Sounds most like peripheral vertigo.  Will treat symptomatically here.  With his acute onset of symptoms I did discuss the case with Dr. Rory Percy, neurology.  Recommended a CT and an MRI.  Signed out to Dr. Ronnald Nian.  Please see his note for further details care in the ED.  The patients results and plan were reviewed and discussed.   Any x-rays performed were independently reviewed by myself.   Differential diagnosis were considered with the presenting HPI.  Medications  sodium chloride 0.9 % bolus 1,000 mL (1,000 mLs Intravenous New Bag/Given 11/24/20 1359)  prochlorperazine (COMPAZINE) injection 5 mg (5 mg Intravenous Given 11/24/20 1400)  diphenhydrAMINE (BENADRYL) injection 12.5 mg (12.5 mg Intravenous Given 11/24/20 1400)  meclizine (ANTIVERT) tablet 25 mg (25 mg Oral Given 11/24/20 1410)    Vitals:   11/24/20 1255 11/24/20 1401 11/24/20 1415 11/24/20 1430  BP: (!) 163/78 135/83 (!) 125/91 132/78  Pulse: (!) 50 (!) 54 (!) 50 60  Resp: 18 15 15 16   Temp: 98.6 F (37 C)     TempSrc: Oral     SpO2: 100% 93% 96% 97%  Weight: 73.9 kg     Height: 5\' 8"  (1.727 m)        Final diagnoses:  Dizziness    Admission/ observation were discussed with the admitting physician, patient  and/or family and they are comfortable with the plan.   Final Clinical Impression(s) / ED Diagnoses Final diagnoses:  Dizziness    Rx / DC Orders ED Discharge Orders    None       Deno Etienne, DO 11/24/20 1519

## 2020-11-24 NOTE — ED Triage Notes (Addendum)
Pt arrives via [facility] from home where he woke up at 10am with dizziness, n/v with movement. Does endorses chronic sinus issues. Hx of vertigo. A/ox4, speech clear, moves all limbs equally. EMS VSS 112/70, 98% on ra, rr16, hr 44 SB,  cbg 136. 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 18g IV RAC, 4mg  zofran & 300cc NS given en route. Pt does have hx of anxiety which he is not medicated for, wife gave him 1/2 tab 0.5mg  klonopin. 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 Upon arrival, pt now reports suprapubic pain, no urine output x12 hours.

## 2020-11-24 NOTE — ED Notes (Signed)
Patient transported to MRI 

## 2020-11-24 NOTE — ED Notes (Signed)
Patient Alert and oriented to baseline. Stable and ambulatory to baseline. Patient verbalized understanding of the discharge instructions.  Patient belongings were taken by the patient.   

## 2022-02-12 NOTE — Progress Notes (Signed)
Surgery orders requested via Epic inbox. °

## 2022-02-21 NOTE — H&P (Incomplete)
TOTAL HIP ADMISSION H&P  Patient is admitted for right total hip arthroplasty.  Subjective:  Chief Complaint: Right hip pain  HPI: Nicholas Stuart, 81 y.o. male, has a history of pain and functional disability in the right hip due to arthritis and patient has failed non-surgical conservative treatments for greater than 12 weeks to include NSAID's and/or analgesics, flexibility and strengthening excercises, and activity modification. Onset of symptoms was gradual, starting  several  years ago with gradually worsening course since that time. The patient noted no past surgery on the right hip. Patient currently rates pain in the right hip at 7 out of 10 with activity. Patient has worsening of pain with activity and weight bearing, pain that interfers with activities of daily living, pain with passive range of motion, and crepitus. Patient has evidence of periarticular osteophytes and joint space narrowing by imaging studies. This condition presents safety issues increasing the risk of falls. There is no current active infection.  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 Authorizing Provider  buPROPion (WELLBUTRIN XL) 300 MG 24 hr tablet Take 300 mg by mouth daily.    [provider]  Calcium Carbonate (CALTRATE 600 PO) Take 1 tablet by mouth daily.    [provider]  finasteride (PROSCAR) 5 MG tablet Take 5 mg by mouth daily.    [provider]  fluticasone (FLONASE) 50  MCG/ACT nasal spray Place into both nostrils daily.    [provider]  latanoprost (XALATAN) 0.005 % ophthalmic solution Place 1 drop into both eyes at bedtime.     [provider]  lisinopril (PRINIVIL,ZESTRIL) 40 MG tablet Take 40 mg by mouth daily.    [provider]  meclizine (ANTIVERT) 25 MG tablet Take 1 tablet (25 mg total) by mouth 2 (two) times daily as needed for up to 30 doses for dizziness. 11/24/20   Curatolo, Adam, DO  omeprazole (PRILOSEC) 20 MG capsule Take 20 mg by mouth daily.    [provider]  oxyCODONE-acetaminophen (PERCOCET/ROXICET) 5-325 MG per tablet Take 1-2 tablets by mouth every 4 (four) hours as needed for severe pain.     [provider]  predniSONE (DELTASONE) 2.5 MG tablet Take 2.5 mg by mouth daily with breakfast. For arthritis in hands.    [provider]  tamsulosin (FLOMAX) 0.4 MG CAPS capsule Take 0.4 mg by mouth at bedtime.    [provider]    Allergies  Allergen Reactions   Codeine     Dizziness.    Sulfa Antibiotics Nausea And Vomiting    Social History   Socioeconomic History   Marital status: Married    Spouse name: Not on file   Number of children: Not on file   Years of education: Not on file   Highest education level: Not on file  Occupational History   Not  on file  Tobacco Use   Smoking status: Never   Smokeless tobacco: Not on file  Substance and Sexual Activity   Alcohol use: No   Drug use: Not on file   Sexual activity: Not on file  Other Topics Concern   Not on file  Social History Narrative   Not on file   Social Determinants of Health   Financial Resource Strain: Not on file  Food Insecurity: Not on file  Transportation Needs: Not on file  Physical Activity: Not on file  Stress: Not on file  Social Connections: Not on file  Intimate Partner Violence: Not on file    Tobacco Use: Not on file   Social History   Substance and Sexual Activity   Alcohol Use No    No family history on file.  ROS   Objective:  Physical Exam: ***  Vital signs in last 24 hours: BP: ()/()  Arterial Line BP: ()/()   Imaging Review Plain radiographs demonstrate {mild/mod/severe:3049053} degenerative joint disease of the right hip. The bone quality appears to be {good/fair/poor/excellent:33178} for age and reported activity level.  Assessment/Plan:  End stage arthritis, right hip  The patient history, physical examination, clinical judgement of the provider and imaging studies are consistent with end stage degenerative joint disease of the right hip and total hip arthroplasty is deemed medically necessary. The treatment options including medical management, injection therapy, arthroscopy and arthroplasty were discussed at length. The risks and benefits of total hip arthroplasty were presented and reviewed. The risks due to aseptic loosening, infection, stiffness, dislocation/subluxation, thromboembolic complications and other imponderables were discussed. The patient acknowledged the explanation, agreed to proceed with the plan and consent was signed. Patient is being admitted for inpatient treatment for surgery, pain control, PT, OT, prophylactic antibiotics, VTE prophylaxis, progressive ambulation and ADLs and discharge planning.The patient is planning to be discharged {home with home health services/to inpatient IDPOE:[id number]}.  {ORTHOADMISSIONSTATUS:21269}  ***  - Patient was instructed on what medications to stop prior to surgery. - Follow-up visit in 2 weeks with Dr. Wynelle Link - Begin physical therapy following surgery - Pre-operative lab work as pre-surgical testing - Prescriptions will be provided in hospital at time of discharge  Fenton Foy, [facility], PA-C Orthopedic Surgery [facility] [place]

## 2022-02-22 NOTE — H&P (Signed)
TOTAL HIP ADMISSION H&P ? ?Patient is admitted for right total hip arthroplasty. ? ?Subjective: ? ?Chief Complaint: Right hip pain ? ?HPI: Nicholas Stuart, 81 y.o. male, has a history of pain and functional disability in the right hip due to arthritis and patient has failed non-surgical conservative treatments for greater than 12 weeks to include corticosteriod injections and activity modification. Onset of symptoms was gradual, starting  several  years ago with gradually worsening course since that time. The patient noted no past surgery on the right hip. Patient currently rates pain in the right hip at 7 out of 10 with activity. Patient has night pain, worsening of pain with activity and weight bearing, pain that interfers with activities of daily living, and pain with passive range of motion. Patient has evidence of  bone-on-bone arthritis in the right hip  by imaging studies. This condition presents safety issues increasing the risk of falls. There is no current active infection. ? ?Patient Active Problem List  ? Diagnosis Date Noted  ? 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 ? ?Past Medical History:  ?Diagnosis Date  ? Anxiety   ? GERD (gastroesophageal reflux disease)   ? Hypertension   ? ? ?Past Surgical History:  ?Procedure Laterality Date  ? HERNIA REPAIR    ? 1989  ? KNEE SURGERY    ? left knee, 2008  ? NECK SURGERY    ? 1983  ? ROTATOR CUFF REPAIR    ? left 07/12/14  ? TONSILLECTOMY    ? ? ?Prior to Admission medications   ?Medication Sig Start Date End Date Taking? Authorizing Provider  ?buPROPion (WELLBUTRIN XL) 300 MG 24 hr tablet Take 300 mg by mouth daily.    [provider]  ?Calcium Carbonate (CALTRATE 600 PO) Take 1 tablet by mouth daily.    [provider]  ?finasteride (PROSCAR) 5 MG tablet Take 5 mg by mouth daily.    [provider]  ?fluticasone (FLONASE) 50 MCG/ACT nasal spray Place into both nostrils daily.     [provider]  ?latanoprost (XALATAN) 0.005 % ophthalmic solution Place 1 drop into both eyes at bedtime.     [provider]  ?lisinopril (PRINIVIL,ZESTRIL) 40 MG tablet Take 40 mg by mouth daily.    [provider]  ?meclizine (ANTIVERT) 25 MG tablet Take 1 tablet (25 mg total) by mouth 2 (two) times daily as needed for up to 30 doses for dizziness. 11/24/20   Curatolo, Adam, DO  ?omeprazole (PRILOSEC) 20 MG capsule Take 20 mg by mouth daily.    [provider]  ?oxyCODONE-acetaminophen (PERCOCET/ROXICET) 5-325 MG per tablet Take 1-2 tablets by mouth every 4 (four) hours as needed for severe pain.     [provider]  ?predniSONE (DELTASONE) 2.5 MG tablet Take 2.5 mg by mouth daily with breakfast. For arthritis in hands.    [provider]  ?tamsulosin (FLOMAX) 0.4 MG CAPS capsule Take 0.4 mg by mouth at bedtime.    [provider]  ? ? ?Allergies  ?Allergen Reactions  ? Codeine   ?  Dizziness.   ? Sulfa Antibiotics Nausea And Vomiting  ? ? ?Social History  ? ?Socioeconomic History  ? Marital status: Married  ?  Spouse name: Not on file  ? Number of children: Not on file  ? Years of education: Not on file  ? Highest education level: Not on file  ?Occupational History  ? Not on file  ?  Tobacco Use  ? Smoking status: Never  ? Smokeless tobacco: Not on file  ?Substance and Sexual Activity  ? Alcohol use: No  ? Drug use: Not on file  ? Sexual activity: Not on file  ?Other Topics Concern  ? Not on file  ?Social History Narrative  ? Not on file  ? ?Social Determinants of Health  ? ?Financial Resource Strain: Not on file  ?Food Insecurity: Not on file  ?Transportation Needs: Not on file  ?Physical Activity: Not on file  ?Stress: Not on file  ?Social Connections: Not on file  ?Intimate Partner Violence: Not on file  ? ? ?Tobacco Use: Not on file  ? ?Social History  ? ?Substance and Sexual Activity  ?Alcohol Use No  ? ? ?No family history on file. ? ?Review of  Systems  ?Constitutional:  Negative for chills and fever.  ?HENT:  Negative for congestion, sore throat and tinnitus.   ?Eyes:  Negative for double vision, photophobia and pain.  ?Respiratory:  Negative for cough, shortness of breath and wheezing.   ?Cardiovascular:  Negative for chest pain, palpitations and orthopnea.  ?Gastrointestinal:  Negative for heartburn, nausea and vomiting.  ?Genitourinary:  Negative for dysuria, frequency and urgency.  ?Musculoskeletal:  Positive for joint pain.  ?Neurological:  Negative for dizziness, weakness and headaches.  ? ? ?Objective: ? ?Physical Exam: ?Well nourished and well developed.  ?General: Alert and oriented x3, cooperative and pleasant, no acute distress.  ?Head: normocephalic, atraumatic, neck supple.  ?Eyes: EOMI.  ?Musculoskeletal: ? ?Right Hip Exam:  ?Range of motion: Flexion to 100 degrees, internal rotation to 0 degrees, external rotation to 0 degrees, and abduction to 20 degrees without discomfort.  ?There is no tenderness over the greater trochanteric bursa. ? ?Calves soft and nontender. Motor function intact in LE. Strength 5/5 LE bilaterally. ?Neuro: Distal pulses 2+. Sensation to light touch intact in LE. ? ? ?Imaging Review ?Plain radiographs demonstrate severe degenerative joint disease of the right hip. The bone quality appears to be adequate for age and reported activity level. ? ?Assessment/Plan: ? ?End stage arthritis, right hip ? ?The patient history, physical examination, clinical judgement of the provider and imaging studies are consistent with end stage degenerative joint disease of the right hip and total hip arthroplasty is deemed medically necessary. The treatment options including medical management, injection therapy, arthroscopy and arthroplasty were discussed at length. The risks and benefits of total hip arthroplasty were presented and reviewed. The risks due to aseptic loosening, infection, stiffness, dislocation/subluxation, thromboembolic  complications and other imponderables were discussed. The patient acknowledged the explanation, agreed to proceed with the plan and consent was signed. Patient is being admitted for inpatient treatment for surgery, pain control, PT, OT, prophylactic antibiotics, VTE prophylaxis, progressive ambulation and ADLs and discharge planning.The patient is planning to be discharged  home . ? ? ?Patient's anticipated LOS is less than 2 midnights, meeting these requirements: ?- Lives within 1 hour of care ?- Has a competent adult at home to recover with post-op recover ?- NO history of ? - Chronic pain requiring opioids ? - Diabetes ? - Coronary Artery Disease ? - Heart failure ? - Heart attack ? - Stroke ? - DVT/VTE ? - Cardiac arrhythmia ? - Respiratory Failure/COPD ? - Renal failure ? - Anemia ? - Advanced Liver disease ? ?Therapy Plans: HEP ?Disposition: Home with wife ?Planned DVT Prophylaxis: Xarelto 10 mg QD ?DME Needed: [facility] ?PCP: Sueanne Margarita, DO (clearance received) ?TXA: IV ?Allergies: Sulfa (anaphylaxis) ?  Anesthesia Concerns: None ?BMI: 25.4 ?Last HgbA1c: Not diabetic. ?Pharmacy: [facility] ([place]) ? ?Other: ?- Hiatal hernia (won't tolerate ASA) ? ?- Patient was instructed on what medications to stop prior to surgery. ?- Follow-up visit in 2 weeks with Dr. Wynelle Link ?- Begin physical therapy following surgery ?- Pre-operative lab work as pre-surgical testing ?- Prescriptions will be provided in hospital at time of discharge ? ?Theresa Duty, PA-C ?Orthopedic Surgery ?[facility] [place] ?  ? ?

## 2022-02-22 NOTE — Patient Instructions (Signed)
DUE TO COVID-19 ONLY ONE VISITOR IS ALLOWED TO COME WITH YOU AND STAY IN THE WAITING ROOM ONLY DURING PRE OP AND PROCEDURE.   **NO VISITORS ARE ALLOWED IN THE SHORT STAY AREA OR RECOVERY ROOM!!**  IF YOU WILL BE ADMITTED INTO THE HOSPITAL YOU ARE ALLOWED ONLY TWO SUPPORT PEOPLE DURING VISITATION HOURS ONLY (7 AM -8PM)   The support person(s) must pass our screening, gel in and out, and wear a mask at all times, including in the patients room. Patients must also wear a mask when staff or their support person are in the room. Visitors GUEST BADGE MUST BE WORN VISIBLY  One adult visitor may remain with you overnight and MUST be in the room by 8 P.M.  No visitors under the age of 49. Any visitor under the age of 71 must be accompanied by an adult.    COVID SWAB TESTING MUST BE COMPLETED ON:  03/01/22    (*ARRIVE AT YOUR APPOINTMENT TIME STAFF IS NOT HERE BEFORE 8AM!!!*)    Site: [facility] [address] [place] Enter: Main Entrance have a seat in the waiting area to the right of main entrance (DO NOT [place]!!!!!) Dial: [phone] to alert staff you have arrived  You are not required to quarantine, however you are required to wear a well-fitted mask when you are out and around people not in your household.  Hand Hygiene often Do NOT share personal items Notify your provider if you are in close contact with someone who has COVID or you develop fever 100.4 or greater, new onset of sneezing, cough, sore throat, shortness of breath or body aches.  [place], [address], must go inside of the hospital, NOT A DRIVE THRU!  (Must self quarantine after testing. Follow instructions on handout.)       Your procedure is scheduled on: 03/05/22   Report to [facility] Main Entrance    Report to admitting at: 8:45 AM   Call this number if you have problems the morning of surgery  [phone]   Do not eat food :After Midnight.   After Midnight you may have the following liquids until : 8:30 AM DAY OF SURGERY  Water Black Coffee (sugar ok, NO MILK/CREAM OR CREAMERS)  Tea (sugar ok, NO MILK/CREAM OR CREAMERS) regular and decaf                             Plain Jell-O (NO RED)                                           Fruit ices (not with fruit pulp, NO RED)                                     Popsicles (NO RED)                                                                  Juice: apple,  WHITE grape, WHITE cranberry Sports drinks like Gatorade (NO RED) Clear broth(vegetable,chicken,beef)               Drink  Ensure drink AT: 8:30 AM the day of surgery.     The day of surgery:  Drink ONE (1) Pre-Surgery Clear Ensure or G2 at AM the morning of surgery. Drink in one sitting. Do not sip.  This drink was given to you during your hospital  pre-op appointment visit. Nothing else to drink after completing the  Pre-Surgery Clear Ensure or G2.          If you have questions, please contact your surgeons office.  FOLLOW BOWEL PREP AND ANY ADDITIONAL PRE OP INSTRUCTIONS YOU RECEIVED FROM YOUR SURGEON'S OFFICE!!!    Oral Hygiene is also important to reduce your risk of infection.                                    Remember - BRUSH YOUR TEETH THE MORNING OF SURGERY WITH YOUR REGULAR TOOTHPASTE   Do NOT smoke after Midnight   Take these medicines the morning of surgery with A SIP OF WATER: bupropion,prednisone,finasteride,omeprazole.Use eye drops and flonase as usual.  DO NOT TAKE ANY ORAL DIABETIC MEDICATIONS DAY OF YOUR SURGERY  Bring CPAP mask and tubing day of surgery.                              You may not have any metal on your body including hair pins, jewelry, and body piercing             Do not wear  lotions, powders, perfumes/cologne, or deodorant              Men may shave face and neck.   Do not bring valuables to the hospital. [place].   Contacts, dentures or bridgework may not be worn into surgery.   Bring small overnight bag day of surgery.    Patients discharged on the day of surgery will not be allowed to drive home.  Someone NEEDS to stay with you for the first 24 hours after anesthesia.   Special Instructions: Bring a copy of your healthcare power of attorney and living will documents         the day of surgery if you haven't scanned them before.              Please read over the following fact sheets you were given: IF YOU HAVE QUESTIONS ABOUT YOUR PRE-OP INSTRUCTIONS PLEASE CALL [phone]     [facility] - Preparing for Surgery Before surgery, you can play an important role.  Because skin is not sterile, your skin needs to be as free of germs as possible.  You can reduce the number of germs on your skin by washing with CHG (chlorahexidine gluconate) soap before surgery.  CHG is an antiseptic cleaner which kills germs and bonds with the skin to continue killing germs even after washing. Please DO NOT use if you have an allergy to CHG or antibacterial soaps.  If your skin becomes reddened/irritated stop using the CHG and inform your nurse when you arrive at Short Stay. Do not shave (including legs and underarms) for at least 48 hours prior to  the first CHG shower.  You may shave your face/neck. Please follow these instructions carefully:  1.  Shower with CHG Soap the night before surgery and the  morning of Surgery.  2.  If you choose to wash your hair, wash your hair first as usual with your  normal  shampoo.  3.  After you shampoo, rinse your hair and body thoroughly to remove the  shampoo.                           4.  Use CHG as you would any other liquid soap.  You can apply chg directly  to the skin and wash                       Gently with a scrungie or clean washcloth.  5.  Apply the CHG Soap to your body ONLY FROM THE NECK DOWN.   Do not use on face/ open                            Wound or open sores. Avoid contact with eyes, ears mouth and genitals (private parts).                       Wash face,  Genitals (private parts) with your normal soap.             6.  Wash thoroughly, paying special attention to the area where your surgery  will be performed.  7.  Thoroughly rinse your body with warm water from the neck down.  8.  DO NOT shower/wash with your normal soap after using and rinsing off  the CHG Soap.                9.  Pat yourself dry with a clean towel.            10.  Wear clean pajamas.            11.  Place clean sheets on your bed the night of your first shower and do not  sleep with pets. Day of Surgery : Do not apply any lotions/deodorants the morning of surgery.  Please wear clean clothes to the hospital/surgery center.  FAILURE TO FOLLOW THESE INSTRUCTIONS MAY RESULT IN THE CANCELLATION OF YOUR SURGERY PATIENT SIGNATURE_________________________________  NURSE SIGNATURE__________________________________  ________________________________________________________________________   Nicholas Stuart  An incentive spirometer is a tool that can help keep your lungs clear and active. This tool measures how well you are filling your lungs with each breath. Taking long deep breaths may help reverse or decrease the chance of developing breathing (pulmonary) problems (especially infection) following: A long period of time when you are unable to move or be active. BEFORE THE PROCEDURE  If the spirometer includes an indicator to show your best effort, your nurse or respiratory therapist will set it to a desired goal. If possible, sit up straight or lean slightly forward. Try not to slouch. Hold the incentive spirometer in an upright position. INSTRUCTIONS FOR USE  Sit on the edge of your bed if possible, or sit up as far as you can in bed or on a chair. Hold the incentive spirometer in an upright position. Breathe out normally. Place the  mouthpiece in your mouth and seal your lips tightly around it. Breathe in slowly and as deeply as possible, raising the piston or  the ball toward the top of the column. Hold your breath for 3-5 seconds or for as long as possible. Allow the piston or ball to fall to the bottom of the column. Remove the mouthpiece from your mouth and breathe out normally. Rest for a few seconds and repeat Steps 1 through 7 at least 10 times every 1-2 hours when you are awake. Take your time and take a few normal breaths between deep breaths. The spirometer may include an indicator to show your best effort. Use the indicator as a goal to work toward during each repetition. After each set of 10 deep breaths, practice coughing to be sure your lungs are clear. If you have an incision (the cut made at the time of surgery), support your incision when coughing by placing a pillow or rolled up towels firmly against it. Once you are able to get out of bed, walk around indoors and cough well. You may stop using the incentive spirometer when instructed by your caregiver.  RISKS AND COMPLICATIONS Take your time so you do not get dizzy or light-headed. If you are in pain, you may need to take or ask for pain medication before doing incentive spirometry. It is harder to take a deep breath if you are having pain. AFTER USE Rest and breathe slowly and easily. It can be helpful to keep track of a log of your progress. Your caregiver can provide you with a simple table to help with this. If you are using the spirometer at home, follow these instructions: Nicholas Stuart IF:  You are having difficultly using the spirometer. You have trouble using the spirometer as often as instructed. Your pain medication is not giving enough relief while using the spirometer. You develop fever of 100.5 F (38.1 C) or higher. SEEK IMMEDIATE MEDICAL CARE IF:  You cough up bloody sputum that had not been present before. You develop fever of 102 F  (38.9 C) or greater. You develop worsening pain at or near the incision site. MAKE SURE YOU:  Understand these instructions. Will watch your condition. Will get help right away if you are not doing well or get worse. Document Released: 04/22/2007 Document Revised: 03/03/2012 Document Reviewed: 06/23/2007 [facility] Patient Information 2014 [place], [place].   ________________________________________________________________________

## 2022-02-26 ENCOUNTER — Encounter (HOSPITAL_COMMUNITY): Payer: Self-pay

## 2022-02-26 ENCOUNTER — Encounter (HOSPITAL_COMMUNITY)
Admission: RE | Admit: 2022-02-26 | Discharge: 2022-02-26 | Disposition: A | Discharge status: Home / Self Care | Payer: Medicare Other | Source: Ambulatory Visit | Attending: Orthopedic Surgery | Admitting: Orthopedic Surgery

## 2022-02-26 ENCOUNTER — Other Ambulatory Visit: Payer: Self-pay

## 2022-02-26 VITALS — BP 149/85 | HR 62 | Temp 98.0°F | Resp 18 | Ht 68.0 in | Wt 159.0 lb

## 2022-02-26 DIAGNOSIS — M1611 Unilateral primary osteoarthritis, right hip: Secondary | ICD-10-CM | POA: Insufficient documentation

## 2022-02-26 DIAGNOSIS — Z01818 Encounter for other preprocedural examination: Secondary | ICD-10-CM | POA: Diagnosis not present

## 2022-02-26 HISTORY — DX: Unspecified osteoarthritis, unspecified site: M19.90

## 2022-02-26 HISTORY — DX: Malignant (primary) neoplasm, unspecified: C80.1

## 2022-02-26 LAB — CBC
HCT: 42.1 % (ref 39.0–52.0)
Hemoglobin: 13.9 g/dL (ref 13.0–17.0)
MCH: 29.6 pg (ref 26.0–34.0)
MCHC: 33 g/dL (ref 30.0–36.0)
MCV: 89.8 fL (ref 80.0–100.0)
Platelets: 296 10*3/uL (ref 150–400)
RBC: 4.69 MIL/uL (ref 4.22–5.81)
RDW: 13.1 % (ref 11.5–15.5)
WBC: 9 10*3/uL (ref 4.0–10.5)
nRBC: 0 % (ref 0.0–0.2)

## 2022-02-26 LAB — COMPREHENSIVE METABOLIC PANEL
ALT: 14 U/L (ref 0–44)
AST: 17 U/L (ref 15–41)
Albumin: 4.2 g/dL (ref 3.5–5.0)
Alkaline Phosphatase: 60 U/L (ref 38–126)
Anion gap: 9 (ref 5–15)
BUN: 22 mg/dL (ref 8–23)
CO2: 26 mmol/L (ref 22–32)
Calcium: 9.1 mg/dL (ref 8.9–10.3)
Chloride: 105 mmol/L (ref 98–111)
Creatinine, Ser: 0.88 mg/dL (ref 0.61–1.24)
GFR, Estimated: >60 mL/min (ref >60)
Glucose, Bld: 101 mg/dL — ABNORMAL HIGH (ref 70–99)
Potassium: 3.1 mmol/L — ABNORMAL LOW (ref 3.5–5.1)
Sodium: 140 mmol/L (ref 135–145)
Total Bilirubin: 0.4 mg/dL (ref 0.3–1.2)
Total Protein: 7 g/dL (ref 6.5–8.1)

## 2022-02-26 LAB — SURGICAL PCR SCREEN
MRSA, PCR: NEGATIVE
Staphylococcus aureus: POSITIVE — AB

## 2022-02-26 NOTE — Progress Notes (Addendum)
For Short Stay: ?COVID SWAB appointment date:03/01/22 @ 1:30 PM ?Date of COVID positive in last 90 days: N/A ?COVID Vaccine: NO ?Bowel Prep reminder: N/A ? ? ?For Anesthesia: ?PCP - DO: Nicholas Stuart. ?Cardiologist -  ? ?Chest x-ray -  ?EKG -  ?Stress Test -  ?ECHO -  ?Cardiac Cath -  ?Pacemaker/ICD device last checked: ?Pacemaker orders received: ?Device Rep notified: ? ?Spinal Cord Stimulator: ? ?Sleep Study -  ?CPAP -  ? ?Fasting Blood Sugar -  ?Checks Blood Sugar _____ times a day ?Date and result of last Hgb A1c- ? ?Blood Thinner Instructions: ?Aspirin Instructions: ?Last Dose: ? ?Activity level: Can go up a flight of stairs and activities of daily living without stopping and without chest pain and/or shortness of breath ?  Able to exercise without chest pain and/or shortness of breath ?  Unable to go up a flight of stairs without chest pain and/or shortness of breath ?   ? ?Anesthesia review:  ? ?Patient denies shortness of breath, fever, cough and chest pain at PAT appointment ? ? ?Patient verbalized understanding of instructions that were given to them at the PAT appointment. Patient was also instructed that they will need to review over the PAT instructions again at home before surgery.  ?

## 2022-02-26 NOTE — Progress Notes (Signed)
PCR: + STAPH °

## 2022-03-01 ENCOUNTER — Encounter (HOSPITAL_COMMUNITY)
Admission: RE | Admit: 2022-03-01 | Discharge: 2022-03-01 | Disposition: A | Discharge status: Home / Self Care | Payer: Medicare Other | Source: Ambulatory Visit | Attending: Orthopedic Surgery | Admitting: Orthopedic Surgery

## 2022-03-01 ENCOUNTER — Other Ambulatory Visit: Payer: Self-pay

## 2022-03-01 DIAGNOSIS — M1611 Unilateral primary osteoarthritis, right hip: Secondary | ICD-10-CM | POA: Diagnosis present

## 2022-03-01 DIAGNOSIS — Z20822 Contact with and (suspected) exposure to covid-19: Secondary | ICD-10-CM | POA: Diagnosis not present

## 2022-03-01 DIAGNOSIS — Z79899 Other long term (current) drug therapy: Secondary | ICD-10-CM | POA: Diagnosis not present

## 2022-03-01 DIAGNOSIS — I10 Essential (primary) hypertension: Secondary | ICD-10-CM | POA: Diagnosis not present

## 2022-03-02 LAB — SARS CORONAVIRUS 2 (TAT 6-24 HRS): SARS Coronavirus 2: NEGATIVE

## 2022-03-05 ENCOUNTER — Other Ambulatory Visit: Payer: Self-pay

## 2022-03-05 ENCOUNTER — Ambulatory Visit (HOSPITAL_COMMUNITY): Payer: Medicare Other | Admitting: Certified Registered"

## 2022-03-05 ENCOUNTER — Ambulatory Visit (HOSPITAL_BASED_OUTPATIENT_CLINIC_OR_DEPARTMENT_OTHER): Payer: Medicare Other | Admitting: Certified Registered"

## 2022-03-05 ENCOUNTER — Encounter (HOSPITAL_COMMUNITY)
Admission: RE | Disposition: A | Discharge status: Home / Self Care | Payer: Self-pay | Source: Ambulatory Visit | Attending: Orthopedic Surgery

## 2022-03-05 ENCOUNTER — Observation Stay (HOSPITAL_COMMUNITY)
Admission: RE | Admit: 2022-03-05 | Discharge: 2022-03-06 | Disposition: A | Discharge status: Home / Self Care | Payer: Medicare Other | Source: Ambulatory Visit | Attending: Orthopedic Surgery | Admitting: Orthopedic Surgery

## 2022-03-05 ENCOUNTER — Observation Stay (HOSPITAL_COMMUNITY): Payer: Medicare Other

## 2022-03-05 ENCOUNTER — Encounter (HOSPITAL_COMMUNITY): Payer: Self-pay | Admitting: Orthopedic Surgery

## 2022-03-05 ENCOUNTER — Ambulatory Visit (HOSPITAL_COMMUNITY): Payer: Medicare Other

## 2022-03-05 DIAGNOSIS — Z96649 Presence of unspecified artificial hip joint: Secondary | ICD-10-CM

## 2022-03-05 DIAGNOSIS — M1611 Unilateral primary osteoarthritis, right hip: Secondary | ICD-10-CM

## 2022-03-05 DIAGNOSIS — Z01818 Encounter for other preprocedural examination: Secondary | ICD-10-CM

## 2022-03-05 DIAGNOSIS — Z20822 Contact with and (suspected) exposure to covid-19: Secondary | ICD-10-CM | POA: Insufficient documentation

## 2022-03-05 DIAGNOSIS — Z79899 Other long term (current) drug therapy: Secondary | ICD-10-CM | POA: Insufficient documentation

## 2022-03-05 DIAGNOSIS — I10 Essential (primary) hypertension: Secondary | ICD-10-CM | POA: Insufficient documentation

## 2022-03-05 DIAGNOSIS — M169 Osteoarthritis of hip, unspecified: Secondary | ICD-10-CM | POA: Diagnosis present

## 2022-03-05 DIAGNOSIS — Z96641 Presence of right artificial hip joint: Secondary | ICD-10-CM

## 2022-03-05 LAB — TYPE AND SCREEN
ABO/RH(D): O POS
Antibody Screen: NEGATIVE

## 2022-03-05 SURGERY — ARTHROPLASTY, HIP, TOTAL, ANTERIOR APPROACH
Anesthesia: Spinal | Site: Hip | Laterality: Right

## 2022-03-05 MED ORDER — WATER FOR IRRIGATION, STERILE IR SOLN
Status: DC | PRN
  Administered 2022-03-05: 2000 mL

## 2022-03-05 MED ORDER — CHLORHEXIDINE GLUCONATE 0.12 % MT SOLN
15.0000 mL | Freq: Once | OROMUCOSAL | Status: AC
  Administered 2022-03-05: 15 mL via OROMUCOSAL

## 2022-03-05 MED ORDER — SODIUM CHLORIDE 0.9 % IV SOLN: INTRAVENOUS | Status: DC

## 2022-03-05 MED ORDER — MEPERIDINE HCL 50 MG/ML IJ SOLN: 6.2500 mg | INTRAMUSCULAR | Status: DC | PRN

## 2022-03-05 MED ORDER — BUPIVACAINE IN DEXTROSE 0.75-8.25 % IT SOLN
INTRATHECAL | Status: DC | PRN
  Administered 2022-03-05: 13.5 mg via INTRATHECAL

## 2022-03-05 MED ORDER — ONDANSETRON HCL 4 MG/2ML IJ SOLN
INTRAMUSCULAR | Status: AC
  Filled 2022-03-05: qty 2

## 2022-03-05 MED ORDER — POVIDONE-IODINE 10 % EX SWAB
2.0000 "application " | Freq: Once | CUTANEOUS | Status: AC
  Administered 2022-03-05: 2 via TOPICAL

## 2022-03-05 MED ORDER — PROPOFOL 1000 MG/100ML IV EMUL
INTRAVENOUS | Status: AC
  Filled 2022-03-05: qty 100

## 2022-03-05 MED ORDER — BUPIVACAINE HCL (PF) 0.25 % IJ SOLN
INTRAMUSCULAR | Status: AC
  Filled 2022-03-05: qty 30

## 2022-03-05 MED ORDER — PANTOPRAZOLE SODIUM 40 MG PO TBEC
40.0000 mg | DELAYED_RELEASE_TABLET | Freq: Every day | ORAL | Status: DC
Start: 2022-03-05 — End: 2022-03-06
  Administered 2022-03-05 – 2022-03-06 (×2): 40 mg via ORAL
  Filled 2022-03-05 (×3): qty 1

## 2022-03-05 MED ORDER — BUPIVACAINE HCL 0.25 % IJ SOLN
INTRAMUSCULAR | Status: DC | PRN
  Administered 2022-03-05: 30 mL

## 2022-03-05 MED ORDER — TRAMADOL HCL 50 MG PO TABS
50.0000 mg | ORAL_TABLET | Freq: Four times a day (QID) | ORAL | Status: DC | PRN
  Filled 2022-03-05: qty 2

## 2022-03-05 MED ORDER — OXYCODONE HCL 5 MG PO TABS: 5.0000 mg | ORAL_TABLET | Freq: Once | ORAL | Status: DC | PRN

## 2022-03-05 MED ORDER — FENTANYL CITRATE (PF) 100 MCG/2ML IJ SOLN
INTRAMUSCULAR | Status: DC | PRN
  Administered 2022-03-05: 50 ug via INTRAVENOUS

## 2022-03-05 MED ORDER — DORZOLAMIDE HCL-TIMOLOL MAL 2-0.5 % OP SOLN
1.0000 [drp] | Freq: Two times a day (BID) | OPHTHALMIC | Status: DC
  Administered 2022-03-05 – 2022-03-06 (×2): 1 [drp] via OPHTHALMIC
  Filled 2022-03-05: qty 10

## 2022-03-05 MED ORDER — HYDROMORPHONE HCL 1 MG/ML IJ SOLN
0.2500 mg | INTRAMUSCULAR | Status: DC | PRN
  Administered 2022-03-05 (×3): 0.25 mg via INTRAVENOUS

## 2022-03-05 MED ORDER — CEFAZOLIN SODIUM-DEXTROSE 2-4 GM/100ML-% IV SOLN
2.0000 g | Freq: Four times a day (QID) | INTRAVENOUS | Status: AC
  Administered 2022-03-05 (×2): 2 g via INTRAVENOUS
  Filled 2022-03-05 (×2): qty 100

## 2022-03-05 MED ORDER — AMISULPRIDE (ANTIEMETIC) 5 MG/2ML IV SOLN
INTRAVENOUS | Status: AC
  Administered 2022-03-05: 5 mg via INTRAVENOUS
  Filled 2022-03-05: qty 2

## 2022-03-05 MED ORDER — TRANEXAMIC ACID-NACL 1000-0.7 MG/100ML-% IV SOLN
1000.0000 mg | INTRAVENOUS | Status: AC
  Administered 2022-03-05: 1000 mg via INTRAVENOUS
  Filled 2022-03-05: qty 100

## 2022-03-05 MED ORDER — CEFAZOLIN SODIUM-DEXTROSE 2-4 GM/100ML-% IV SOLN
2.0000 g | INTRAVENOUS | Status: AC
  Administered 2022-03-05: 2 g via INTRAVENOUS
  Filled 2022-03-05: qty 100

## 2022-03-05 MED ORDER — HYDROCHLOROTHIAZIDE 25 MG PO TABS
25.0000 mg | ORAL_TABLET | Freq: Every day | ORAL | Status: DC
  Administered 2022-03-06: 25 mg via ORAL
  Filled 2022-03-05: qty 1

## 2022-03-05 MED ORDER — METHOCARBAMOL 500 MG IVPB - SIMPLE MED
INTRAVENOUS | Status: AC
  Filled 2022-03-05: qty 50

## 2022-03-05 MED ORDER — OXYCODONE HCL 5 MG/5ML PO SOLN: 5.0000 mg | Freq: Once | ORAL | Status: DC | PRN

## 2022-03-05 MED ORDER — HYDROCODONE-ACETAMINOPHEN 5-325 MG PO TABS: 1.0000 | ORAL_TABLET | ORAL | Status: DC | PRN

## 2022-03-05 MED ORDER — PROPOFOL 10 MG/ML IV BOLUS
INTRAVENOUS | Status: AC
  Filled 2022-03-05: qty 20

## 2022-03-05 MED ORDER — FINASTERIDE 5 MG PO TABS
5.0000 mg | ORAL_TABLET | Freq: Every day | ORAL | Status: DC
  Administered 2022-03-05 – 2022-03-06 (×2): 5 mg via ORAL
  Filled 2022-03-05 (×3): qty 1

## 2022-03-05 MED ORDER — PHENOL 1.4 % MT LIQD: 1.0000 | OROMUCOSAL | Status: DC | PRN

## 2022-03-05 MED ORDER — TAMSULOSIN HCL 0.4 MG PO CAPS
0.4000 mg | ORAL_CAPSULE | Freq: Every day | ORAL | Status: DC
  Administered 2022-03-05 – 2022-03-06 (×2): 0.4 mg via ORAL
  Filled 2022-03-05 (×3): qty 1

## 2022-03-05 MED ORDER — LACTATED RINGERS IV SOLN: INTRAVENOUS | Status: DC

## 2022-03-05 MED ORDER — METHOCARBAMOL 500 MG IVPB - SIMPLE MED
500.0000 mg | Freq: Four times a day (QID) | INTRAVENOUS | Status: DC | PRN
  Administered 2022-03-05: 500 mg via INTRAVENOUS
  Filled 2022-03-05: qty 50

## 2022-03-05 MED ORDER — BUPROPION HCL ER (XL) 300 MG PO TB24
300.0000 mg | ORAL_TABLET | Freq: Every day | ORAL | Status: DC
  Administered 2022-03-05 – 2022-03-06 (×2): 300 mg via ORAL
  Filled 2022-03-05 (×3): qty 1

## 2022-03-05 MED ORDER — PHENYLEPHRINE HCL-NACL 20-0.9 MG/250ML-% IV SOLN
INTRAVENOUS | Status: DC | PRN
Start: 2022-03-05 — End: 2022-03-05
  Administered 2022-03-05: 20 ug/min via INTRAVENOUS

## 2022-03-05 MED ORDER — ONDANSETRON HCL 4 MG PO TABS: 4.0000 mg | ORAL_TABLET | Freq: Four times a day (QID) | ORAL | Status: DC | PRN

## 2022-03-05 MED ORDER — MENTHOL 3 MG MT LOZG: 1.0000 | LOZENGE | OROMUCOSAL | Status: DC | PRN

## 2022-03-05 MED ORDER — DEXAMETHASONE SODIUM PHOSPHATE 10 MG/ML IJ SOLN
INTRAMUSCULAR | Status: AC
  Filled 2022-03-05: qty 1

## 2022-03-05 MED ORDER — METOCLOPRAMIDE HCL 5 MG PO TABS: 5.0000 mg | ORAL_TABLET | Freq: Three times a day (TID) | ORAL | Status: DC | PRN

## 2022-03-05 MED ORDER — ONDANSETRON HCL 4 MG/2ML IJ SOLN
4.0000 mg | Freq: Four times a day (QID) | INTRAMUSCULAR | Status: DC | PRN
  Administered 2022-03-05: 4 mg via INTRAVENOUS
  Filled 2022-03-05: qty 2

## 2022-03-05 MED ORDER — ORAL CARE MOUTH RINSE: 15.0000 mL | Freq: Once | OROMUCOSAL | Status: AC

## 2022-03-05 MED ORDER — HYDROCODONE-ACETAMINOPHEN 7.5-325 MG PO TABS
1.0000 | ORAL_TABLET | ORAL | Status: DC | PRN
  Administered 2022-03-05 – 2022-03-06 (×2): 2 via ORAL
  Filled 2022-03-05 (×2): qty 2

## 2022-03-05 MED ORDER — PROPOFOL 10 MG/ML IV BOLUS
INTRAVENOUS | Status: DC | PRN
  Administered 2022-03-05: 20 mg via INTRAVENOUS

## 2022-03-05 MED ORDER — RIVAROXABAN 10 MG PO TABS
10.0000 mg | ORAL_TABLET | Freq: Every day | ORAL | Status: DC
  Administered 2022-03-06: 10 mg via ORAL
  Filled 2022-03-05: qty 1

## 2022-03-05 MED ORDER — DEXAMETHASONE SODIUM PHOSPHATE 10 MG/ML IJ SOLN
10.0000 mg | Freq: Once | INTRAMUSCULAR | Status: AC
  Administered 2022-03-06: 10 mg via INTRAVENOUS
  Filled 2022-03-05: qty 1

## 2022-03-05 MED ORDER — MIDAZOLAM HCL 2 MG/2ML IJ SOLN: 0.5000 mg | Freq: Once | INTRAMUSCULAR | Status: DC | PRN

## 2022-03-05 MED ORDER — POLYETHYLENE GLYCOL 3350 17 G PO PACK: 17.0000 g | PACK | Freq: Every day | ORAL | Status: DC | PRN

## 2022-03-05 MED ORDER — DOCUSATE SODIUM 100 MG PO CAPS
100.0000 mg | ORAL_CAPSULE | Freq: Two times a day (BID) | ORAL | Status: DC
  Administered 2022-03-05 – 2022-03-06 (×2): 100 mg via ORAL
  Filled 2022-03-05 (×2): qty 1

## 2022-03-05 MED ORDER — DEXAMETHASONE SODIUM PHOSPHATE 10 MG/ML IJ SOLN
8.0000 mg | Freq: Once | INTRAMUSCULAR | Status: AC
  Administered 2022-03-05: 4 mg via INTRAVENOUS

## 2022-03-05 MED ORDER — LATANOPROST 0.005 % OP SOLN
1.0000 [drp] | Freq: Every day | OPHTHALMIC | Status: DC
  Administered 2022-03-05: 1 [drp] via OPHTHALMIC
  Filled 2022-03-05: qty 2.5

## 2022-03-05 MED ORDER — BISACODYL 10 MG RE SUPP: 10.0000 mg | Freq: Every day | RECTAL | Status: DC | PRN

## 2022-03-05 MED ORDER — CLONAZEPAM 0.125 MG PO TBDP: 0.2500 mg | ORAL_TABLET | Freq: Three times a day (TID) | ORAL | Status: DC | PRN

## 2022-03-05 MED ORDER — ACETAMINOPHEN 10 MG/ML IV SOLN
1000.0000 mg | Freq: Four times a day (QID) | INTRAVENOUS | Status: DC
  Administered 2022-03-05: 1000 mg via INTRAVENOUS
  Filled 2022-03-05: qty 100

## 2022-03-05 MED ORDER — AMISULPRIDE (ANTIEMETIC) 5 MG/2ML IV SOLN
5.0000 mg | Freq: Once | INTRAVENOUS | Status: AC
Start: 2022-03-05 — End: 2022-03-05

## 2022-03-05 MED ORDER — METHOCARBAMOL 500 MG PO TABS
500.0000 mg | ORAL_TABLET | Freq: Four times a day (QID) | ORAL | Status: DC | PRN
Start: 2022-03-05 — End: 2022-03-06

## 2022-03-05 MED ORDER — ONDANSETRON HCL 4 MG/2ML IJ SOLN
INTRAMUSCULAR | Status: DC | PRN
  Administered 2022-03-05: 4 mg via INTRAVENOUS

## 2022-03-05 MED ORDER — 0.9 % SODIUM CHLORIDE (POUR BTL) OPTIME
TOPICAL | Status: DC | PRN
  Administered 2022-03-05: 1000 mL

## 2022-03-05 MED ORDER — PROPOFOL 500 MG/50ML IV EMUL
INTRAVENOUS | Status: DC | PRN
  Administered 2022-03-05: 60 ug/kg/min via INTRAVENOUS

## 2022-03-05 MED ORDER — HYDROMORPHONE HCL 1 MG/ML IJ SOLN
INTRAMUSCULAR | Status: AC
  Administered 2022-03-05: 0.25 mg via INTRAVENOUS
  Filled 2022-03-05: qty 1

## 2022-03-05 MED ORDER — FENTANYL CITRATE (PF) 100 MCG/2ML IJ SOLN
INTRAMUSCULAR | Status: AC
Start: 2022-03-05 — End: ?
  Filled 2022-03-05: qty 2

## 2022-03-05 MED ORDER — MORPHINE SULFATE (PF) 2 MG/ML IV SOLN: 0.5000 mg | INTRAVENOUS | Status: DC | PRN

## 2022-03-05 MED ORDER — METOCLOPRAMIDE HCL 5 MG/ML IJ SOLN
5.0000 mg | Freq: Three times a day (TID) | INTRAMUSCULAR | Status: DC | PRN
  Administered 2022-03-05: 10 mg via INTRAVENOUS
  Filled 2022-03-05: qty 2

## 2022-03-05 SURGICAL SUPPLY — 47 items
ARTICULEZE HEAD 36 12 (Hips) ×2 IMPLANT
BAG COUNTER SPONGE SURGICOUNT (BAG) IMPLANT
BAG DECANTER FOR FLEXI CONT (MISCELLANEOUS) IMPLANT
BAG SPEC THK2 15X12 ZIP CLS (MISCELLANEOUS)
BAG SPNG CNTER NS LX DISP (BAG)
BAG ZIPLOCK 12X15 (MISCELLANEOUS) IMPLANT
BLADE SAG 18X100X1.27 (BLADE) ×3 IMPLANT
COVER PERINEAL POST (MISCELLANEOUS) ×3 IMPLANT
COVER SURGICAL LIGHT HANDLE (MISCELLANEOUS) ×3 IMPLANT
CUP ACETBLR 54 OD PINNACLE (Hips) ×1 IMPLANT
DRAPE FOOT SWITCH (DRAPES) ×3 IMPLANT
DRAPE STERI IOBAN 125X83 (DRAPES) ×3 IMPLANT
DRAPE U-SHAPE 47X51 STRL (DRAPES) ×6 IMPLANT
DRESSING AQUACEL AG SP 3.5X10 (GAUZE/BANDAGES/DRESSINGS) IMPLANT
DRSG AQUACEL AG ADV 3.5X10 (GAUZE/BANDAGES/DRESSINGS) ×3 IMPLANT
DRSG AQUACEL AG SP 3.5X10 (GAUZE/BANDAGES/DRESSINGS) ×2
DURAPREP 26ML APPLICATOR (WOUND CARE) ×3 IMPLANT
ELECT REM PT RETURN 15FT ADLT (MISCELLANEOUS) ×3 IMPLANT
GLOVE SRG 8 PF TXTR STRL LF DI (GLOVE) ×2 IMPLANT
GLOVE SURG ENC MOIS LTX SZ6.5 (GLOVE) ×3 IMPLANT
GLOVE SURG ENC MOIS LTX SZ7 (GLOVE) ×3 IMPLANT
GLOVE SURG ENC MOIS LTX SZ8 (GLOVE) ×6 IMPLANT
GLOVE SURG UNDER POLY LF SZ7 (GLOVE) ×3 IMPLANT
GLOVE SURG UNDER POLY LF SZ8 (GLOVE) ×2
GLOVE SURG UNDER POLY LF SZ8.5 (GLOVE) IMPLANT
GOWN STRL REUS W/ TWL LRG LVL3 (GOWN DISPOSABLE) ×4 IMPLANT
GOWN STRL REUS W/TWL LRG LVL3 (GOWN DISPOSABLE) ×4
HEAD ARTICULEZE 36 12 (Hips) IMPLANT
HOLDER FOLEY CATH W/STRAP (MISCELLANEOUS) ×3 IMPLANT
KIT TURNOVER KIT A (KITS) IMPLANT
LINER MARATHON NEUT +4X54X36 (Hips) ×1 IMPLANT
MANIFOLD NEPTUNE II (INSTRUMENTS) ×3 IMPLANT
PACK ANTERIOR HIP CUSTOM (KITS) ×3 IMPLANT
PENCIL SMOKE EVACUATOR COATED (MISCELLANEOUS) ×3 IMPLANT
SPIKE FLUID TRANSFER (MISCELLANEOUS) ×3 IMPLANT
SPONGE T-LAP 18X18 ~~LOC~~+RFID (SPONGE) ×6 IMPLANT
STEM FEMORAL SZ6 HIGH ACTIS (Stem) ×1 IMPLANT
STRIP CLOSURE SKIN 1/2X4 (GAUZE/BANDAGES/DRESSINGS) ×3 IMPLANT
SUT ETHIBOND NAB CT1 #1 30IN (SUTURE) ×3 IMPLANT
SUT MNCRL AB 4-0 PS2 18 (SUTURE) ×3 IMPLANT
SUT STRATAFIX 0 PDS 27 VIOLET (SUTURE) ×2
SUT VIC AB 2-0 CT1 27 (SUTURE) ×4
SUT VIC AB 2-0 CT1 TAPERPNT 27 (SUTURE) ×4 IMPLANT
SUTURE STRATFX 0 PDS 27 VIOLET (SUTURE) ×2 IMPLANT
SYR 50ML LL SCALE MARK (SYRINGE) IMPLANT
TRAY FOLEY MTR SLVR 16FR STAT (SET/KITS/TRAYS/PACK) ×3 IMPLANT
TUBE SUCTION HIGH CAP CLEAR NV (SUCTIONS) ×3 IMPLANT

## 2022-03-05 NOTE — Evaluation (Signed)
Physical Therapy Evaluation ?Patient Details ?Name: Nicholas Stuart ?MRN: [id number] ?DOB: February 23, 1941 ?Today's Date: 03/05/2022 ? ?History of Present Illness ? 81 yo male s/p R DA THA. PMH: sensorineural hearing loss, tinnitus, RCR  ?Clinical Impression ? Pt is s/p THA resulting in the deficits listed below (see PT Problem List).  ?Pt dry heaving on PT arrival. Gown and pad soaked form ice bag leaking. Pt agreeable to sit EOB, was able to sit EOB for gown change and stand briefly for bed pad change, a few lateral steps along EOB with RW and  min assist, limited by pain and nausea. RN made aware. Pt  assisted to return to supine and reported incr comfort but still with significant nausea.  ?Continue to follow  ? Pt will benefit from skilled PT to increase their independence and safety with mobility to allow discharge to the venue listed below.  ?   ?   ? ?Recommendations for follow up therapy are one component of a multi-disciplinary discharge planning process, led by the attending physician.  Recommendations may be updated based on patient status, additional functional criteria and insurance authorization. ? ?Follow Up Recommendations Follow physician's recommendations for discharge plan and follow up therapies ? ?  ?Assistance Recommended at Discharge Intermittent Supervision/Assistance  ?Patient can return home with the following ? A little help with walking and/or transfers;Help with stairs or ramp for entrance;Assistance with cooking/housework;Assist for transportation;A little help with bathing/dressing/bathroom ? ?  ?Equipment Recommendations Other (comment) (TBD)  ?Recommendations for Other Services ?    ?  ?Functional Status Assessment Patient has had a recent decline in their functional status and demonstrates the ability to make significant improvements in function in a reasonable and predictable amount of time.  ? ?  ?Precautions / Restrictions Precautions ?Precautions: Fall ?Restrictions ?Weight Bearing  Restrictions: No ?Other Position/Activity Restrictions: WBAT  ? ?  ? ?Mobility ? Bed Mobility ?Overal bed mobility: Needs Assistance ?Bed Mobility: Supine to Sit, Sit to Supine ?  ?  ?Supine to sit: Min assist ?Sit to supine: Min assist ?  ?General bed mobility comments: assist with progressing RLE off bed, bil LE assist on return to supine ?  ? ?Transfers ?Overall transfer level: Needs assistance ?Equipment used: Rolling walker (2 wheels) ?Transfers: Sit to/from Stand ?Sit to Stand: Min assist ?  ?  ?  ?  ?  ?General transfer comment: assist to safely rise and transition to RW, cues for hand placement ?  ? ?Ambulation/Gait ?  ?  ?  ?  ?  ?  ?  ?General Gait Details: unable d/t pain and nausea ? ?Stairs ?  ?  ?  ?  ?  ? ?Wheelchair Mobility ?  ? ?Modified Rankin (Stroke Patients Only) ?  ? ?  ? ?Balance   ?  ?  ?  ?  ?  ?  ?  ?  ?  ?  ?  ?  ?  ?  ?  ?  ?  ?  ?   ? ? ? ?Pertinent Vitals/Pain Pain Assessment ?Pain Assessment: 0-10 ?Pain Score: 5  ?Pain Location: right hip ?Pain Descriptors / Indicators: Grimacing, Sore ?Pain Intervention(s): Monitored during session, Limited activity within patient's tolerance, Repositioned  ? ? ?Home Living Family/patient expects to be discharged to:: Private residence ?Living Arrangements: Spouse/significant other;Children ?  ?  ?  ?  ?  ?  ?  ?  ?   ?  ?Prior Function Prior Level of Function : Independent/Modified Independent ?  ?  ?  ?  ?  ?  ?  ?  ?  ? ? ?  Hand Dominance  ?   ? ?  ?Extremity/Trunk Assessment  ? Upper Extremity Assessment ?Upper Extremity Assessment: Overall WFL for tasks assessed ?  ? ?Lower Extremity Assessment ?Lower Extremity Assessment: RLE deficits/detail ?RLE Deficits / Details: ankle WFL, knee and hip grossly 2+/5,  limited by pain ?RLE: Unable to fully assess due to pain ?  ? ?   ?Communication  ? Communication: HOH  ?Cognition Arousal/Alertness: Awake/alert ?Behavior During Therapy: [facility] for tasks assessed/performed ?Overall Cognitive Status: Within  Functional Limits for tasks assessed ?  ?  ?  ?  ?  ?  ?  ?  ?  ?  ?  ?  ?  ?  ?  ?  ?  ?  ?  ? ?  ?General Comments   ? ?  ?Exercises Total Joint Exercises ?Ankle Circles/Pumps: AROM, Both, 5 reps  ? ?Assessment/Plan  ?  ?PT Assessment Patient needs continued PT services  ?PT Problem List Decreased strength;Decreased mobility;Decreased activity tolerance;Decreased balance;Decreased knowledge of use of DME;Pain ? ?   ?  ?PT Treatment Interventions Therapeutic exercise;DME instruction;Functional mobility training;Therapeutic activities;Patient/family education;Gait training;Stair training   ? ?PT Goals (Current goals can be found in the Care Plan section)  ?Acute Rehab PT Goals ?Patient Stated Goal: feel better ?PT Goal Formulation: With patient ?Time For Goal Achievement: 03/12/22 ?Potential to Achieve Goals: Good ? ?  ?Frequency 7X/week ?  ? ? ?Co-evaluation   ?  ?  ?  ?  ? ? ?  ?AM-PAC PT "6 Clicks" Mobility  ?Outcome Measure Help needed turning from your back to your side while in a flat bed without using bedrails?: A Little ?Help needed moving from lying on your back to sitting on the side of a flat bed without using bedrails?: A Little ?Help needed moving to and from a bed to a chair (including a wheelchair)?: A Lot ?Help needed standing up from a chair using your arms (e.g., wheelchair or bedside chair)?: A Lot ?Help needed to walk in hospital room?: Total ?Help needed climbing 3-5 steps with a railing? : Total ?6 Click Score: 12 ? ?  ?End of Session   ?Activity Tolerance: Patient limited by pain;Other (comment) (nausea) ?  ?  ?PT Visit Diagnosis: Other abnormalities of gait and mobility (R26.89);Difficulty in walking, not elsewhere classified (R26.2) ?  ? ?Time: 2423-5361 ?PT Time Calculation (min) (ACUTE ONLY): 15 min ? ? ?Charges:   PT Evaluation ?$PT Eval Low Complexity: 1 Low ?  ?  ?   ? ? ?Nicholas Stuart, PT ? ?Acute Rehab Dept [facility]) [phone] ?Pager [phone] ? ?03/05/2022 ? ? ?Nicholas Stuart ?03/05/2022,  6:30 PM ? ?

## 2022-03-05 NOTE — Anesthesia Postprocedure Evaluation (Signed)
Anesthesia Post Note ? ?Patient: Nicholas Stuart ? ?Procedure(s) Performed: TOTAL HIP ARTHROPLASTY ANTERIOR APPROACH (Right: Hip) ? ?  ? ?Patient location during evaluation: PACU ?Anesthesia Type: Spinal ?Level of consciousness: awake and alert, patient cooperative and oriented ?Pain management: pain level controlled ?Vital Signs Assessment: post-procedure vital signs reviewed and stable ?Respiratory status: spontaneous breathing, nonlabored ventilation, respiratory function stable and patient connected to nasal cannula oxygen ?Cardiovascular status: blood pressure returned to baseline and stable ?Postop Assessment: spinal receding and no apparent nausea or vomiting ?Anesthetic complications: no ? ? ?No notable events documented. ? ?Last Vitals:  ?Vitals:  ? 03/05/22 1345 03/05/22 1400  ?BP: 135/79 139/78  ?Pulse: (!) 57 (!) 53  ?Resp: 18 14  ?Temp:    ?SpO2: 95% 100%  ?  ?Last Pain:  ?Vitals:  ? 03/05/22 1400  ?TempSrc:   ?PainSc: 0-No pain  ? ? ?  ?  ?  ?  ?  ?  ? ?Jupiter Kabir,E. Natane Heward ? ? ? ? ?

## 2022-03-05 NOTE — Anesthesia Procedure Notes (Signed)
Procedure Name: [place] ?Date/Time: 03/05/2022 11:16 AM ?Performed by: Eben Burow, CRNA ?Pre-anesthesia Checklist: Patient identified, Emergency Drugs available, Suction available, Patient being monitored and Timeout performed ?Oxygen Delivery Method: Simple face mask ?Placement Confirmation: positive ETCO2 ? ? ? ? ?

## 2022-03-05 NOTE — Anesthesia Procedure Notes (Signed)
Spinal ? ?Patient location during procedure: OR ?End time: 03/05/2022 11:33 AM ?Reason for block: surgical anesthesia ?Staffing ?Performed: anesthesiologist  ?Anesthesiologist: Annye Asa, MD ?Resident/CRNA: Eben Burow, CRNA ?Preanesthetic Checklist ?Completed: patient identified, IV checked, site marked, risks and benefits discussed, surgical consent, monitors and equipment checked, pre-op evaluation and timeout performed ?Spinal Block ?Patient position: sitting ?Prep: DuraPrep and site prepped and draped ?Patient monitoring: blood pressure, continuous pulse ox, cardiac monitor and heart rate ?Location: L4-5 ?Injection technique: single-shot ?Needle ?Needle type: Quincke  ?Needle gauge: 22 G ?Needle length: 9 cm ?Assessment ?Events: CSF return and second provider ?Additional Notes ?Pt identified in Operating room.  Monitors applied. Working IV access confirmed. Sterile prep, drape lumbar spine.  1% lido local L 3,4.  CRNA all attempts os with #24ga Pencan, Repeat local L2,3 and all attempts os by MD, Repeat local L 4,5 and  #22ga Quincke into clear CSF L 3,4.  13.'5mg'$  0.75% Bupivacaine with dextrose injected with asp CSF beginning and end of injection.  Patient asymptomatic, VSS, no heme aspirated, tolerated well.  Jenita Seashore, MD ?  ? ? ? ?

## 2022-03-05 NOTE — Op Note (Signed)
?    OPERATIVE REPORT- TOTAL HIP ARTHROPLASTY ? ? ?PREOPERATIVE DIAGNOSIS: Osteoarthritis of the Right hip.  ? ?POSTOPERATIVE DIAGNOSIS: Osteoarthritis of the Right  hip.  ? ?PROCEDURE: Right total hip arthroplasty, anterior approach.  ? ?SURGEON: Gaynelle Arabian, MD  ? ?ASSISTANT: Theresa Duty, PA-C ? ?ANESTHESIA:  Spinal ? ?ESTIMATED BLOOD LOSS:-500 mL   ? ?DRAINS: Hemovac x1.  ? ?COMPLICATIONS: None ?  ?CONDITION: PACU - hemodynamically stable.  ? ?BRIEF CLINICAL NOTE: Nicholas Stuart is a 81 y.o. male who has advanced end-  ?stage arthritis of their Right  hip with progressively worsening pain and  ?dysfunction.The patient has failed nonoperative management and presents for  ?total hip arthroplasty.  ? ?PROCEDURE IN DETAIL: After successful administration of spinal  ?anesthetic, the traction boots for the [facility] bed were placed on both  ?feet and the patient was placed onto the [facility] bed, boots placed into the leg  ?holders. The Right hip was then isolated from the perineum with plastic  ?drapes and prepped and draped in the usual sterile fashion. ASIS and  ?greater trochanter were marked and a oblique incision was made, starting  ?at about 1 cm lateral and 2 cm distal to the ASIS and coursing towards  ?the anterior cortex of the femur. The skin was cut with a 10 blade  ?through subcutaneous tissue to the level of the fascia overlying the  ?tensor fascia lata muscle. The fascia was then incised in line with the  ?incision at the junction of the anterior third and posterior 2/3rd. The  ?muscle was teased off the fascia and then the interval between the TFL  ?and the rectus was developed. The Hohmann retractor was then placed at  ?the top of the femoral neck over the capsule. The vessels overlying the  ?capsule were cauterized and the fat on top of the capsule was removed.  ?A Hohmann retractor was then placed anterior underneath the rectus  ?femoris to give exposure to the entire anterior capsule. A T-shaped   ?capsulotomy was performed. The edges were tagged and the femoral head  ?was identified. ?      Osteophytes are removed off the superior acetabulum.  ?The femoral neck was then cut in situ with an oscillating saw. Traction  ?was then applied to the left lower extremity utilizing the [facility]  ?traction. The femoral head was then removed. Retractors were placed  ?around the acetabulum and then circumferential removal of the labrum was  ?performed. Osteophytes were also removed. Reaming starts at 49 mm to  ?medialize and  Increased in 2 mm increments to 53 mm. We reamed in  ?approximately 40 degrees of abduction, 20 degrees anteversion. A 54 mm  ?pinnacle acetabular shell was then impacted in anatomic position under  ?fluoroscopic guidance with excellent purchase. We did not need to place  ?any additional dome screws. A 36 mm neutral + 4 marathon liner was then  ?placed into the acetabular shell.  ?     The femoral lift was then placed along the lateral aspect of the femur  ?just distal to the vastus ridge. The leg was  externally rotated and capsule  ?was stripped off the inferior aspect of the femoral neck down to the  ?level of the lesser trochanter, this was done with electrocautery. The femur was lifted after this was performed. The  leg was then placed in an extended and adducted position essentially delivering the femur. We also removed the capsule superiorly and the piriformis from the piriformis  fossa to gain excellent exposure of the  ?proximal femur. Rongeur was used to remove some cancellous bone to get  ?into the lateral portion of the proximal femur for placement of the  ?initial starter reamer. The starter broaches was placed  the starter broach  and was shown to go down the center of the canal. Broaching  ?with the Actis system was then performed starting at size 0  coursing  ?Up to size 6. A size 6 had excellent torsional and rotational  ?and axial stability. The trial high offset neck was then placed   ?with a 36 + 12 trial head. The hip was then reduced. We confirmed that  ?the stem was in the canal both on AP and lateral x-rays. It also has excellent sizing. The hip was reduced with outstanding stability through full extension and full external rotation.. AP pelvis was taken and the leg lengths were measured and found to be equal. Hip was then dislocated again and the femoral head and neck removed. The  ?femoral broach was removed. Size 6 Actis stem with a high offset  ?neck was then impacted into the femur following native anteversion. Has  ?excellent purchase in the canal. Excellent torsional and rotational and  ?axial stability. It is confirmed to be in the canal on AP and lateral  ?fluoroscopic views. The 36 + 12 metal head was placed and the hip  ?reduced with outstanding stability. Again AP pelvis was taken and it  ?confirmed that the leg lengths were equal. The wound was then copiously  ?irrigated with saline solution and the capsule reattached and repaired  ?with Ethibond suture. 30 ml of .25% Bupivicaine was  injected into the capsule and into the edge of the tensor fascia lata as well as subcutaneous tissue. The fascia overlying the tensor fascia lata was then closed with a running #1 V-Loc. Subcu was closed with interrupted 2-0 Vicryl and subcuticular running 4-0 Monocryl. Incision was cleaned  ?and dried. Steri-Strips and a bulky sterile dressing applied. The patient was awakened and transported to  ?recovery in stable condition.  ?      Please note that a surgical assistant was a medical necessity for this procedure to perform it in a safe and expeditious manner. Assistant was necessary to provide appropriate retraction of vital neurovascular structures and to prevent femoral fracture and allow for anatomic placement of the prosthesis. ? ?Gaynelle Arabian, M.D.  ? ? ?

## 2022-03-05 NOTE — Transfer of Care (Signed)
Immediate Anesthesia Transfer of Care Note ? ?Patient: Nicholas Stuart ? ?Procedure(s) Performed: TOTAL HIP ARTHROPLASTY ANTERIOR APPROACH (Right: Hip) ? ?Patient Location: PACU ? ?Anesthesia Type:Spinal ? ?Level of Consciousness: drowsy ? ?Airway & Oxygen Therapy: Patient Spontanous Breathing and Patient connected to face mask oxygen ? ?Post-op Assessment: Report given to RN and Post -op Vital signs reviewed and stable ? ?Post vital signs: Reviewed and stable ? ?Last Vitals:  ?Vitals Value Taken Time  ?BP 145/90 03/05/22 1306  ?Temp    ?Pulse 62 03/05/22 1308  ?Resp 18 03/05/22 1308  ?SpO2 100 % 03/05/22 1308  ?Vitals shown include unvalidated device data. ? ?Last Pain:  ?Vitals:  ? 03/05/22 0937  ?TempSrc: Oral  ?PainSc:   ?   ? ?Patients Stated Pain Goal: 5 (03/05/22 0930) ? ?Complications: No notable events documented. ?

## 2022-03-05 NOTE — Anesthesia Preprocedure Evaluation (Addendum)
Anesthesia Evaluation  ?Patient identified by MRN, date of birth, ID band ?Patient awake ? ? ? ?Reviewed: ?Allergy & Precautions, NPO status , Patient's Chart, lab work & pertinent test results ? ?History of Anesthesia Complications ?Negative for: history of anesthetic complications ? ?Airway ?Mallampati: II ? ?TM Distance: >3 FB ?Neck ROM: Full ? ? ? Dental ? ?(+) Dental Advisory Given ?  ?Pulmonary ?neg pulmonary ROS,  ?03/01/2022 SARS coronavirus NEG ?  ?breath sounds clear to auscultation ? ? ? ? ? ? Cardiovascular ?hypertension, Pt. on medications ?(-) angina ?Rhythm:Regular Rate:Normal ? ? ?  ?Neuro/Psych ?Anxiety negative neurological ROS ?   ? GI/Hepatic ?Neg liver ROS, GERD  Medicated and Controlled,  ?Endo/Other  ?negative endocrine ROS ? Renal/GU ?negative Renal ROS  ? ?  ?Musculoskeletal ? ?(+) Arthritis , Osteoarthritis,   ? Abdominal ?  ?Peds ? Hematology ?negative hematology ROS ?(+)   ?Anesthesia Other Findings ? ? Reproductive/Obstetrics ? ?  ? ? ? ? ? ? ? ? ? ? ? ? ? ?  ?  ? ? ? ? ? ? ? ?Anesthesia Physical ?Anesthesia Plan ? ?ASA: 2 ? ?Anesthesia Plan: Spinal  ? ?Post-op Pain Management: Ofirmev IV (intra-op)*  ? ?Induction:  ? ?PONV Risk Score and Plan: 1 and Ondansetron ? ?Airway Management Planned: Natural Airway and Simple Face Mask ? ?Additional Equipment: None ? ?Intra-op Plan:  ? ?Post-operative Plan:  ? ?Informed Consent: I have reviewed the patients History and Physical, chart, labs and discussed the procedure including the risks, benefits and alternatives for the proposed anesthesia with the patient or authorized representative who has indicated his/her understanding and acceptance.  ? ? ? ?Dental advisory given ? ?Plan Discussed with: CRNA and Surgeon ? ?Anesthesia Plan Comments: (Plan routine monitors, SAB )  ? ? ? ? ? ?Anesthesia Quick Evaluation ? ?

## 2022-03-05 NOTE — Discharge Instructions (Signed)
Information on my medicine - XARELTO (Rivaroxaban)     Why was Xarelto prescribed for you? Xarelto was prescribed for you to reduce the risk of blood clots forming after orthopedic surgery. The medical term for these abnormal blood clots is venous thromboembolism (VTE).  What do you need to know about xarelto ? Take your Xarelto ONCE DAILY at the same time every day. You may take it either with or without food.  If you have difficulty swallowing the tablet whole, you may crush it and mix in applesauce just prior to taking your dose.  Take Xarelto exactly as prescribed by your doctor and DO NOT stop taking Xarelto without talking to the doctor who prescribed the medication.  Stopping without other VTE prevention medication to take the place of Xarelto may increase your risk of developing a clot.  After discharge, you should have regular check-up appointments with your healthcare provider that is prescribing your Xarelto.    What do you do if you miss a dose? If you miss a dose, take it as soon as you remember on the same day then continue your regularly scheduled once daily regimen the next day. Do not take two doses of Xarelto on the same day.   Important Safety Information A possible side effect of Xarelto is bleeding. You should call your healthcare provider right away if you experience any of the following: Bleeding from an injury or your nose that does not stop. Unusual colored urine (red or dark brown) or unusual colored stools (red or black). Unusual bruising for unknown reasons. A serious fall or if you hit your head (even if there is no bleeding).  Some medicines may interact with Xarelto and might increase your risk of bleeding while on Xarelto. To help avoid this, consult your healthcare provider or pharmacist prior to using any new prescription or non-prescription medications, including herbals, vitamins, non-steroidal anti-inflammatory drugs (NSAIDs) and  supplements.  This website has more information on Xarelto: [email or web address]

## 2022-03-05 NOTE — Interval H&P Note (Signed)
History and Physical Interval Note: ? ?03/05/2022 ?9:17 AM ? ?Nicholas Stuart  has presented today for surgery, with the diagnosis of right hip osteoarthritis.  The various methods of treatment have been discussed with the patient and family. After consideration of risks, benefits and other options for treatment, the patient has consented to  Procedure(s): ?TOTAL HIP ARTHROPLASTY ANTERIOR APPROACH (Right) as a surgical intervention.  The patient's history has been reviewed, patient examined, no change in status, stable for surgery.  I have reviewed the patient's chart and labs.  Questions were answered to the patient's satisfaction.   ? ? ?Nicholas Stuart ? ? ?

## 2022-03-06 ENCOUNTER — Encounter (HOSPITAL_COMMUNITY): Payer: Self-pay | Admitting: Orthopedic Surgery

## 2022-03-06 ENCOUNTER — Other Ambulatory Visit (HOSPITAL_COMMUNITY): Payer: Self-pay

## 2022-03-06 DIAGNOSIS — M1611 Unilateral primary osteoarthritis, right hip: Secondary | ICD-10-CM | POA: Diagnosis not present

## 2022-03-06 LAB — CBC
HCT: 34.9 % — ABNORMAL LOW (ref 39.0–52.0)
Hemoglobin: 11.6 g/dL — ABNORMAL LOW (ref 13.0–17.0)
MCH: 30.1 pg (ref 26.0–34.0)
MCHC: 33.2 g/dL (ref 30.0–36.0)
MCV: 90.6 fL (ref 80.0–100.0)
Platelets: 242 10*3/uL (ref 150–400)
RBC: 3.85 MIL/uL — ABNORMAL LOW (ref 4.22–5.81)
RDW: 13.1 % (ref 11.5–15.5)
WBC: 11.1 10*3/uL — ABNORMAL HIGH (ref 4.0–10.5)
nRBC: 0 % (ref 0.0–0.2)

## 2022-03-06 LAB — BASIC METABOLIC PANEL
Anion gap: 8 (ref 5–15)
BUN: 19 mg/dL (ref 8–23)
CO2: 26 mmol/L (ref 22–32)
Calcium: 8.3 mg/dL — ABNORMAL LOW (ref 8.9–10.3)
Chloride: 104 mmol/L (ref 98–111)
Creatinine, Ser: 0.79 mg/dL (ref 0.61–1.24)
GFR, Estimated: >60 mL/min (ref >60)
Glucose, Bld: 134 mg/dL — ABNORMAL HIGH (ref 70–99)
Potassium: 3.9 mmol/L (ref 3.5–5.1)
Sodium: 138 mmol/L (ref 135–145)

## 2022-03-06 MED ORDER — METHOCARBAMOL 500 MG PO TABS: 500.0000 mg | ORAL_TABLET | Freq: Four times a day (QID) | ORAL | 0 refills | Status: DC | PRN

## 2022-03-06 MED ORDER — TRAMADOL HCL 50 MG PO TABS
50.0000 mg | ORAL_TABLET | Freq: Four times a day (QID) | ORAL | 0 refills | Status: AC | PRN
  Filled 2022-03-06 (×2): qty 40, 5d supply, fill #0

## 2022-03-06 MED ORDER — METHOCARBAMOL 500 MG PO TABS
500.0000 mg | ORAL_TABLET | Freq: Four times a day (QID) | ORAL | 0 refills | Status: AC | PRN
Start: 2022-03-06 — End: ?
  Filled 2022-03-06 (×2): qty 40, 10d supply, fill #0

## 2022-03-06 MED ORDER — RIVAROXABAN 10 MG PO TABS
10.0000 mg | ORAL_TABLET | Freq: Every day | ORAL | 0 refills | Status: AC
  Filled 2022-03-06: qty 20, 20d supply, fill #0

## 2022-03-06 MED ORDER — HYDROCODONE-ACETAMINOPHEN 5-325 MG PO TABS: 1.0000 | ORAL_TABLET | Freq: Four times a day (QID) | ORAL | 0 refills | Status: DC | PRN

## 2022-03-06 MED ORDER — TRAMADOL HCL 50 MG PO TABS: 50.0000 mg | ORAL_TABLET | Freq: Four times a day (QID) | ORAL | 0 refills | Status: DC | PRN

## 2022-03-06 MED ORDER — RIVAROXABAN 10 MG PO TABS: 10.0000 mg | ORAL_TABLET | Freq: Every day | ORAL | 0 refills | Status: DC

## 2022-03-06 MED ORDER — HYDROCODONE-ACETAMINOPHEN 5-325 MG PO TABS
1.0000 | ORAL_TABLET | Freq: Four times a day (QID) | ORAL | 0 refills | Status: AC | PRN
  Filled 2022-03-06: qty 42, 6d supply, fill #0

## 2022-03-06 NOTE — Progress Notes (Signed)
Discharge package printed and instructions given to patient. Patient verbalizes understanding. 

## 2022-03-06 NOTE — Progress Notes (Signed)
Physical Therapy Treatment ?Patient Details ?Name: Nicholas Stuart ?MRN: [id number] ?DOB: [birth year] ?Today's Date: 03/06/2022 ? ? ?History of Present Illness 81 yo male s/p R DA THA. PMH: sensorineural hearing loss, tinnitus, RCR ? ?  ?PT Comments  ? ? Pt progressing well today. Will see again this pm and reviewed stairs, continue HEP and pt should be ready to d/c with assist prn   ?Recommendations for follow up therapy are one component of a multi-disciplinary discharge planning process, led by the attending physician.  Recommendations may be updated based on patient status, additional functional criteria and insurance authorization. ? ?Follow Up Recommendations ? Follow physician's recommendations for discharge plan and follow up therapies ?  ?  ?Assistance Recommended at Discharge Intermittent Supervision/Assistance  ?Patient can return home with the following A little help with walking and/or transfers;Help with stairs or ramp for entrance;Assistance with cooking/housework;Assist for transportation;A little help with bathing/dressing/bathroom ?  ?Equipment Recommendations ? None recommended by PT  ?  ?Recommendations for Other Services   ? ? ?  ?Precautions / Restrictions Precautions ?Precautions: Fall ?Restrictions ?Weight Bearing Restrictions: No ?Other Position/Activity Restrictions: WBAT  ?  ? ?Mobility ? Bed Mobility ?Overal bed mobility: Needs Assistance ?Bed Mobility: Supine to Sit ?  ?  ?Supine to sit: Min assist ?  ?  ?General bed mobility comments: assist with progressing RLE off bed ?  ? ?Transfers ?Overall transfer level: Needs assistance ?Equipment used: Rolling walker (2 wheels) ?Transfers: Sit to/from Stand ?Sit to Stand: Min assist, Min guard ?  ?  ?  ?  ?  ?General transfer comment: assist to safely rise and transition to RW, cues for hand placement ?  ? ?Ambulation/Gait ?Ambulation/Gait assistance: Min guard ?Gait Distance (Feet): 80 Feet ?Assistive device: Rolling walker (2 wheels) ?Gait  Pattern/deviations: Step-to pattern, Decreased stance time - right ?  ?  ?  ?General Gait Details: cues for sequence and RW position ? ? ?Stairs ?  ?  ?  ?  ?  ? ? ?Wheelchair Mobility ?  ? ?Modified Rankin (Stroke Patients Only) ?  ? ? ?  ?Balance   ?  ?  ?  ?  ?  ?  ?  ?  ?  ?  ?  ?  ?  ?  ?  ?  ?  ?  ?  ? ?  ?Cognition Arousal/Alertness: Awake/alert ?Behavior During Therapy: [facility] for tasks assessed/performed ?Overall Cognitive Status: Within Functional Limits for tasks assessed ?  ?  ?  ?  ?  ?  ?  ?  ?  ?  ?  ?  ?  ?  ?  ?  ?  ?  ?  ? ?  ?Exercises Total Joint Exercises ?Ankle Circles/Pumps: AROM, Both, 10 reps ?Quad Sets: AROM, Strengthening, Both, 10 reps ?Heel Slides: AROM, Right, 10 reps ? ?  ?General Comments   ?  ?  ? ?Pertinent Vitals/Pain Pain Assessment ?Pain Assessment: 0-10 ?Pain Score: 4  ?Pain Location: right hip ?Pain Descriptors / Indicators: Grimacing, Sore, Burning ?Pain Intervention(s): Limited activity within patient's tolerance, Monitored during session, Premedicated before session, Repositioned  ? ? ?Home Living   ?  ?  ?  ?  ?  ?  ?  ?  ?  ?   ?  ?Prior Function    ?  ?  ?   ? ?PT Goals (current goals can now be found in the care plan section) Acute Rehab PT Goals ?Patient Stated Goal: feel  better ?PT Goal Formulation: With patient ?Time For Goal Achievement: 03/12/22 ?Potential to Achieve Goals: Good ?Progress towards PT goals: Progressing toward goals ? ?  ?Frequency ? ? ? 7X/week ? ? ? ?  ?PT Plan Current plan remains appropriate  ? ? ?Co-evaluation   ?  ?  ?  ?  ? ?  ?AM-PAC PT "6 Clicks" Mobility   ?Outcome Measure ? Help needed turning from your back to your side while in a flat bed without using bedrails?: A Little ?Help needed moving from lying on your back to sitting on the side of a flat bed without using bedrails?: A Little ?Help needed moving to and from a bed to a chair (including a wheelchair)?: A Little ?Help needed standing up from a chair using your arms (e.g., wheelchair or  bedside chair)?: A Little ?Help needed to walk in hospital room?: A Little ?Help needed climbing 3-5 steps with a railing? : A Little ?6 Click Score: 18 ? ?  ?End of Session Equipment Utilized During Treatment: Gait belt ?Activity Tolerance: Patient tolerated treatment well ?Patient left: with call bell/phone within reach;in chair;with chair alarm set ?Nurse Communication: Mobility status ?PT Visit Diagnosis: Other abnormalities of gait and mobility (R26.89);Difficulty in walking, not elsewhere classified (R26.2) ?  ? ? ?Time: 3662-9476 ?PT Time Calculation (min) (ACUTE ONLY): 25 min ? ?Charges:  $Gait Training: 8-22 mins ?$Therapeutic Exercise: 8-22 mins          ?          ? Baxter Flattery, PT ? ?Acute Rehab Dept [facility]) [phone] ?Pager [phone] ? ?03/06/2022 ? ? ? ?[name] ?03/06/2022, 1:13 PM ? ?

## 2022-03-06 NOTE — Plan of Care (Signed)
  Problem: Activity: Goal: Risk for activity intolerance will decrease Outcome: Progressing   Problem: Pain Managment: Goal: General experience of comfort will improve Outcome: Progressing   Problem: Safety: Goal: Ability to remain free from injury will improve Outcome: Progressing   

## 2022-03-06 NOTE — Plan of Care (Signed)
?  Problem: Health Behavior/Discharge Planning: ?Goal: Ability to manage health-related needs will improve ?Outcome: Progressing ?  ?Problem: Pain Managment: ?Goal: General experience of comfort will improve ?Outcome: Progressing ?  ?Problem: Safety: ?Goal: Ability to remain free from injury will improve ?Outcome: Progressing ?  ?Problem: Activity: ?Goal: Ability to avoid complications of mobility impairment will improve ?Outcome: Progressing ?  ?

## 2022-03-06 NOTE — Progress Notes (Signed)
? ?  Subjective: ?1 Day Post-Op Procedure(s) (LRB): ?TOTAL HIP ARTHROPLASTY ANTERIOR APPROACH (Right) ?Patient reports pain as mild.   ?Patient seen in rounds by Dr. Wynelle Link. ?Patient is doing well, but appears fatigued. Denies chest pain or Sob. No issues overnight. Foley catheter removed this AM. ?We will continue therapy today, sat on EOB yesterday but did not ambulate.  ? ?Objective: ?Vital signs in last 24 hours: ?Temp:  [97.6 ?F (36.4 ?C)-98.7 ?F (37.1 ?C)] 98.7 ?F (37.1 ?C) (03/14 0606) ?Pulse Rate:  [50-84] 80 (03/14 0606) ?Resp:  [14-18] 17 (03/14 0606) ?BP: (126-166)/(74-90) 137/84 (03/14 0606) ?SpO2:  [94 %-100 %] 94 % (03/14 0606) ?Weight:  [72.1 kg] 72.1 kg (03/13 0930) ? ?Intake/Output from previous day: ? ?Intake/Output Summary (Last 24 hours) at 03/06/2022 0816 ?Last data filed at 03/06/2022 0530 ?Gross per 24 hour  ?Intake 3217.04 ml  ?Output 2325 ml  ?Net 892.04 ml  ?  ? ?Intake/Output this shift: ?No intake/output data recorded. ? ?Labs: ?Recent Labs  ?  03/06/22 ?0319  ?HGB 11.6*  ? ?Recent Labs  ?  03/06/22 ?0319  ?WBC 11.1*  ?RBC 3.85*  ?HCT 34.9*  ?PLT 242  ? ?Recent Labs  ?  03/06/22 ?0319  ?NA 138  ?K 3.9  ?CL 104  ?CO2 26  ?BUN 19  ?CREATININE 0.79  ?GLUCOSE 134*  ?CALCIUM 8.3*  ? ?No results for input(s): LABPT, INR in the last 72 hours. ? ?Exam: ?General - Patient is Alert and Oriented ?Extremity - Neurologically intact ?Neurovascular intact ?Sensation intact distally ?Dorsiflexion/Plantar flexion intact ?Dressing - dressing C/D/I ?Motor Function - intact, moving foot and toes well on exam.  ? ?Past Medical History:  ?Diagnosis Date  ? Anxiety   ? Arthritis   ? Cancer [facility])   ? GERD (gastroesophageal reflux disease)   ? Hypertension   ? ? ?Assessment/Plan: ?1 Day Post-Op Procedure(s) (LRB): ?TOTAL HIP ARTHROPLASTY ANTERIOR APPROACH (Right) ?Principal Problem: ?  OA (osteoarthritis) of hip ?Active Problems: ?  Primary osteoarthritis of right hip ? ?Estimated body mass index is 24.18 kg/m? as  calculated from the following: ?  Height as of this encounter: '5\' 8"'$  (1.727 m). ?  Weight as of this encounter: 72.1 kg. ?Advance diet ?Up with therapy ?D/C IV fluids ? ?DVT Prophylaxis - Xarelto ?Weight bearing as tolerated. ?Continue therapy. ? ?Plan is to go Home after hospital stay. ?Possible discharge this afternoon if doing well and progresses with therapy. Otherwise will stay until tomorrow.  ?HEP ?Follow-up in the office in 2 weeks ? ?The PDMP database was reviewed today prior to any opioid medications being prescribed to this patient. ? ?Theresa Duty, PA-C ?Orthopedic Surgery ?[phone] ?03/06/2022, 8:16 AM ? ?

## 2022-03-06 NOTE — Progress Notes (Signed)
Physical Therapy Treatment ?Patient Details ?Name: Nicholas Stuart ?MRN: [id number] ?DOB: [birth year] ?Today's Date: 03/06/2022 ? ? ?History of Present Illness 81 yo male s/p R DA THA. PMH: sensorineural hearing loss, tinnitus, RCR ? ?  ?PT Comments  ? ? Progressing, meeting PT goals. Reviewed mobility areas as noted below, instructed in progression of HEP. Ready for d/c with family assist as needed from PT standpoint   ?Recommendations for follow up therapy are one component of a multi-disciplinary discharge planning process, led by the attending physician.  Recommendations may be updated based on patient status, additional functional criteria and insurance authorization. ? ?Follow Up Recommendations ? Follow physician's recommendations for discharge plan and follow up therapies ?  ?  ?Assistance Recommended at Discharge Intermittent Supervision/Assistance  ?Patient can return home with the following A little help with walking and/or transfers;Help with stairs or ramp for entrance;Assistance with cooking/housework;Assist for transportation;A little help with bathing/dressing/bathroom ?  ?Equipment Recommendations ? None recommended by PT  ?  ?Recommendations for Other Services   ? ? ?  ?Precautions / Restrictions Precautions ?Precautions: Fall ?Restrictions ?Weight Bearing Restrictions: No ?Other Position/Activity Restrictions: WBAT  ?  ? ?Mobility ? Bed Mobility ?Overal bed mobility: Needs Assistance ?Bed Mobility: Supine to Sit, Sit to Supine ?  ?  ?Supine to sit: Supervision ?Sit to supine: Supervision ?  ?General bed mobility comments: for safety, incr time and effort ?  ? ?Transfers ?Overall transfer level: Needs assistance ?Equipment used: Rolling walker (2 wheels) ?Transfers: Sit to/from Stand ?Sit to Stand: Min guard, Supervision ?  ?  ?  ?  ?  ?General transfer comment: cues for hand placement and RLE management ?  ? ?Ambulation/Gait ?Ambulation/Gait assistance: Min guard, Supervision ?Gait Distance (Feet):  100 Feet ?Assistive device: Rolling walker (2 wheels) ?Gait Pattern/deviations: Step-to pattern, Decreased stance time - right ?  ?  ?  ?General Gait Details: cues for sequence and RW position ? ? ?Stairs ?Stairs: Yes ?Stairs assistance: Min assist, Min guard ?Stair Management: No rails, Step to pattern, Forwards, With walker ?Number of Stairs: 1 ?General stair comments: verbal cues for sequence, min/guard for safety ? ? ?Wheelchair Mobility ?  ? ?Modified Rankin (Stroke Patients Only) ?  ? ? ?  ?Balance   ?  ?  ?  ?  ?  ?  ?  ?  ?  ?  ?  ?  ?  ?  ?  ?  ?  ?  ?  ? ?  ?Cognition Arousal/Alertness: Awake/alert ?Behavior During Therapy: [facility] for tasks assessed/performed ?Overall Cognitive Status: Within Functional Limits for tasks assessed ?  ?  ?  ?  ?  ?  ?  ?  ?  ?  ?  ?  ?  ?  ?  ?  ?  ?  ?  ? ?  ?Exercises Total Joint Exercises ?Ankle Circles/Pumps: AROM, Both, 10 reps ?Quad Sets: AROM, Strengthening, Both, 10 reps ?Heel Slides: AROM, Right, 5 reps ?Hip ABduction/ADduction: AROM, Right, 5 reps ?Long Arc Quad: AROM, Right, 5 reps, Seated ? ?  ?General Comments   ?  ?  ? ?Pertinent Vitals/Pain Pain Assessment ?Pain Assessment: 0-10 ?Pain Score: 4  ?Pain Location: right hip ?Pain Descriptors / Indicators: Grimacing, Sore, Burning ?Pain Intervention(s): Limited activity within patient's tolerance, Monitored during session, Premedicated before session, Repositioned  ? ? ?Home Living   ?  ?  ?  ?  ?  ?  ?  ?  ?  ?   ?  ?  Prior Function    ?  ?  ?   ? ?PT Goals (current goals can now be found in the care plan section) Acute Rehab PT Goals ?Patient Stated Goal: feel better ?PT Goal Formulation: With patient ?Time For Goal Achievement: 03/12/22 ?Potential to Achieve Goals: Good ?Progress towards PT goals: Progressing toward goals ? ?  ?Frequency ? ? ? 7X/week ? ? ? ?  ?PT Plan Current plan remains appropriate  ? ? ?Co-evaluation   ?  ?  ?  ?  ? ?  ?AM-PAC PT "6 Clicks" Mobility   ?Outcome Measure ? Help needed turning from  your back to your side while in a flat bed without using bedrails?: None ?Help needed moving from lying on your back to sitting on the side of a flat bed without using bedrails?: None ?Help needed moving to and from a bed to a chair (including a wheelchair)?: A Little ?Help needed standing up from a chair using your arms (e.g., wheelchair or bedside chair)?: A Little ?Help needed to walk in hospital room?: A Little ?Help needed climbing 3-5 steps with a railing? : A Little ?6 Click Score: 20 ? ?  ?End of Session Equipment Utilized During Treatment: Gait belt ?Activity Tolerance: Patient tolerated treatment well ?Patient left: with call bell/phone within reach;in bed;with bed alarm set ?Nurse Communication: Mobility status ?PT Visit Diagnosis: Other abnormalities of gait and mobility (R26.89);Difficulty in walking, not elsewhere classified (R26.2) ?  ? ? ?Time: 8127-5170 ?PT Time Calculation (min) (ACUTE ONLY): 29 min ? ?Charges:  $Gait Training: 23-37 mins ?$Therapeutic Exercise: 8-22 mins          ?          ? Baxter Flattery, PT ? ?Acute Rehab Dept [facility]) [phone] ?Pager [phone] ? ?03/06/2022 ? ? ? ?Nicholas Stuart ?03/06/2022, 3:00 PM ? ?

## 2022-03-06 NOTE — TOC Transition Note (Signed)
Transition of Care (TOC) - CM/SW Discharge Note ? ?Patient Details  ?Name: Nicholas Stuart ?MRN: [id number] ?Date of Birth: 1941-07-06 ? ?Transition of Care (TOC) CM/SW Contact:  ?Sherie Don, LCSW ?Phone Number: ?03/06/2022, 9:40 AM ? ?Clinical Narrative: Patient is expected to discharge home after working with PT. CSW met with patient to confirm discharge plan. Patient will discharge home with a home exercise program (HEP). Patient has a rolling walker and rollator at home, so there are no DME needs at this time. TOC signing off. ? ?Final next level of care: Home/Self Care ?Barriers to Discharge: No Barriers Identified ? ?Patient Goals and CMS Choice ?Patient states their goals for this hospitalization and ongoing recovery are:: Discharge home with HEP ?Choice offered to / list presented to : NA ? ?Discharge Plan and Services         ?DME Arranged: N/A ?DME Agency: NA ? ?Readmission Risk Interventions ?No flowsheet data found. ? ?

## 2022-03-07 NOTE — Discharge Summary (Signed)
Patient ID: ?Nicholas Stuart ?MRN: [id number] ?DOB/AGE: February 07, 1941 81 y.o. ? ?Admit date: 03/05/2022 ?Discharge date: 03/06/2022 ? ?Admission Diagnoses:  ?Principal Problem: ?  OA (osteoarthritis) of hip ?Active Problems: ?  Primary osteoarthritis of right hip ? ? ?Discharge Diagnoses:  ?Same ? ?Past Medical History:  ?Diagnosis Date  ? Anxiety   ? Arthritis   ? Cancer Nicholas Stuart)   ? GERD (gastroesophageal reflux disease)   ? Hypertension   ? ? ?Surgeries: Procedure(s): ?TOTAL HIP ARTHROPLASTY ANTERIOR APPROACH on 03/05/2022 ?  ?Consultants:  ? ?Discharged Condition: Improved ? ?Hospital Course: Nicholas Stuart is an 81 y.o. male who was admitted 03/05/2022 for operative treatment ofOA (osteoarthritis) of hip. Patient has severe unremitting pain that affects sleep, daily activities, and work/hobbies. After pre-op clearance the patient was taken to the operating room on 03/05/2022 and underwent  Procedure(s): ?TOTAL HIP ARTHROPLASTY ANTERIOR APPROACH.   ? ?Patient was given perioperative antibiotics:  ?Anti-infectives (From admission, onward)  ? ? Start     Dose/Rate Route Frequency Ordered Stop  ? 03/05/22 1800  ceFAZolin (ANCEF) IVPB 2g/100 mL premix       ? 2 g ?200 mL/hr over 30 Minutes Intravenous Every 6 hours 03/05/22 1457 03/06/22 0956  ? 03/05/22 0915  ceFAZolin (ANCEF) IVPB 2g/100 mL premix       ? 2 g ?200 mL/hr over 30 Minutes Intravenous On call to O.R. 03/05/22 0913 03/05/22 1145  ? ?  ?  ? ?Patient was given sequential compression devices, early ambulation, and chemoprophylaxis to prevent DVT. ? ?Patient benefited maximally from hospital stay and there were no complications.   ? ?Recent vital signs: Patient Vitals for the past 24 hrs: ? BP Temp Temp src Pulse Resp SpO2  ?03/06/22 1419 (!) 151/74 97.7 ?F (36.5 ?C) Oral 72 18 97 %  ?03/06/22 1000 134/85 98.3 ?F (36.8 ?C) Oral 77 17 94 %  ?  ? ?Recent laboratory studies:  ?Recent Labs  ?  03/06/22 ?0319  ?WBC 11.1*  ?HGB 11.6*  ?HCT 34.9*  ?PLT 242  ?NA 138  ?K 3.9   ?CL 104  ?CO2 26  ?BUN 19  ?CREATININE 0.79  ?GLUCOSE 134*  ?CALCIUM 8.3*  ? ? ? ?Discharge Medications:   ?Allergies as of 03/06/2022   ? ?   Reactions  ? Codeine   ? Dizziness.   ? Sulfa Antibiotics Nausea And Vomiting  ? ?  ? ?  ?Medication List  ?  ? ?STOP taking these medications   ? ?CALTRATE 600 PO ?  ?meclizine 25 MG tablet ?Commonly known as: ANTIVERT ?  ? ?  ? ?TAKE these medications   ? ?acetaminophen 325 MG tablet ?Commonly known as: TYLENOL ?Take 650 mg by mouth every 6 (six) hours as needed for moderate pain. ?  ?buPROPion 300 MG 24 hr tablet ?Commonly known as: WELLBUTRIN XL ?Take 300 mg by mouth daily. ?  ?clonazePAM 0.5 MG tablet ?Commonly known as: KLONOPIN ?Take 0.25 mg by mouth 3 (three) times daily as needed for anxiety. ?  ?dorzolamide-timolol 22.3-6.8 MG/ML ophthalmic solution ?Commonly known as: COSOPT ?Place 1 drop into both eyes 2 (two) times daily. ?  ?finasteride 5 MG tablet ?Commonly known as: PROSCAR ?Take 5 mg by mouth daily. ?  ?fluticasone 50 MCG/ACT nasal spray ?Commonly known as: FLONASE ?Place 1 spray into both nostrils daily. ?  ?hydrochlorothiazide 25 MG tablet ?Commonly known as: HYDRODIURIL ?Take 25 mg by mouth daily. ?  ?HYDROcodone-acetaminophen 5-325 MG tablet ?Commonly known as:  NORCO/VICODIN ?Take 1 - 2 tablets by mouth every 6 hours as needed for severe pain. ?  ?hydrocortisone cream 1 % ?Apply 1 application topically daily as needed for itching. ?  ?latanoprost 0.005 % ophthalmic solution ?Commonly known as: XALATAN ?Place 1 drop into both eyes at bedtime. ?  ?lisinopril 40 MG tablet ?Commonly known as: ZESTRIL ?Take 40 mg by mouth daily. ?  ?methocarbamol 500 MG tablet ?Commonly known as: ROBAXIN ?Take 1 tablet (500 mg total) by mouth every 6 (six) hours as needed for muscle spasms. ?  ?omeprazole 20 MG capsule ?Commonly known as: PRILOSEC ?Take 20 mg by mouth daily. ?  ?tamsulosin 0.4 MG Caps capsule ?Commonly known as: FLOMAX ?Take 0.4 mg by mouth daily. ?  ?traMADol  50 MG tablet ?Commonly known as: ULTRAM ?Take 1 - 2 tablets by mouth every 6 hours as needed for moderate pain. ?  ?Xarelto 10 MG Tabs tablet ?Generic drug: rivaroxaban ?Take 1 tablet (10 mg total) by mouth daily with breakfast for 20 days. ?  ? ?  ? ?  ?  ? ? ?  ?Discharge Care Instructions  ?(From admission, onward)  ?  ? ? ?  ? ?  Start     Ordered  ? 03/06/22 0000  Weight bearing as tolerated       ? 03/06/22 0821  ? 03/06/22 0000  Change dressing       ?Comments: You have an adhesive waterproof bandage over the incision. Leave this in place until your first follow-up appointment. Once you remove this you will not need to place another bandage.  ? 03/06/22 4970  ? ?  ?  ? ?  ? ? ?Diagnostic Studies: DG Pelvis Portable ? ?Result Date: 03/05/2022 ?CLINICAL DATA:  Postop right hip EXAM: PORTABLE PELVIS 1-2 VIEWS COMPARISON:  None. FINDINGS: Postsurgical changes are seen of recent total right hip arthroplasty. No perihardware lucency is seen to indicate hardware failure or loosening. Expected postoperative changes including soft tissue swelling and subcutaneous air about the lateral right hip and thigh. No acute fracture or dislocation. Moderate left femoroacetabular joint space narrowing. IMPRESSION: Status post total right hip arthroplasty without evidence of hardware failure. Electronically Signed   By: Yvonne Kendall M.D.   On: 03/05/2022 14:14  ? ?DG C-Arm 1-60 Min-No Report ? ?Result Date: 03/05/2022 ?Fluoroscopy was utilized by the requesting physician.  No radiographic interpretation.  ? ?DG C-Arm 1-60 Min-No Report ? ?Result Date: 03/05/2022 ?Fluoroscopy was utilized by the requesting physician.  No radiographic interpretation.  ? ?DG HIP PORT UNILAT WITH PELVIS 1V RIGHT ? ?Result Date: 03/05/2022 ?CLINICAL DATA:  Right total hip arthroplasty EXAM: DG HIP (WITH OR WITHOUT PELVIS) 1V PORT RIGHT; DG C-ARM 1-60 MIN-NO REPORT COMPARISON:  None. FLUOROSCOPY: Exposure Index (as provided by the fluoroscopic device):  1.1 mGy Kerma FINDINGS: Two nondiagnostic spot fluoroscopic intraoperative right hip radiographs demonstrate expected changes from right total hip arthroplasty with no evidence of right hip dislocation on this frontal view. IMPRESSION: Intraoperative fluoroscopic guidance for right total hip arthroplasty. Electronically Signed   By: Ilona Sorrel M.D.   On: 03/05/2022 12:56   ? ?Disposition: Discharge disposition: 01-Home or Self Care ? ? ? ? ? ? ?Discharge Instructions   ? ? Call MD / Call 911   Complete by: As directed ?  ? If you experience chest pain or shortness of breath, CALL 911 and be transported to the hospital emergency room.  If you develope a fever above 101 F,  pus (white drainage) or increased drainage or redness at the wound, or calf pain, call your surgeon's office.  ? Change dressing   Complete by: As directed ?  ? You have an adhesive waterproof bandage over the incision. Leave this in place until your first follow-up appointment. Once you remove this you will not need to place another bandage.  ? Constipation Prevention   Complete by: As directed ?  ? Drink plenty of fluids.  Prune juice may be helpful.  You may use a stool softener, such as Colace (over the counter) 100 mg twice a day.  Use MiraLax (over the counter) for constipation as needed.  ? Diet - low sodium heart healthy   Complete by: As directed ?  ? Do not sit on low chairs, stoools or toilet seats, as it may be difficult to get up from low surfaces   Complete by: As directed ?  ? Driving restrictions   Complete by: As directed ?  ? No driving for two weeks  ? Post-operative opioid taper instructions:   Complete by: As directed ?  ? POST-OPERATIVE OPIOID TAPER INSTRUCTIONS: ?It is important to wean off of your opioid medication as soon as possible. If you do not need pain medication after your surgery it is ok to stop day one. ?Opioids include: ?Codeine, Hydrocodone(Norco, Vicodin), Oxycodone(Percocet, oxycontin) and hydromorphone  amongst others.  ?Long term and even short term use of opiods can cause: ?Increased pain response ?Dependence ?Constipation ?Depression ?Respiratory depression ?And more.  ?Withdrawal symptoms can include ?

## 2022-03-13 ENCOUNTER — Telehealth (HOSPITAL_COMMUNITY): Payer: Self-pay

## 2022-03-13 NOTE — Telephone Encounter (Signed)
Transitions of Care Pharmacy  ° °Call attempted for a pharmacy transitions of care follow-up. HIPAA appropriate voicemail was left with call back information provided.  ° °Call attempt #1. Will follow-up in 2-3 days.  °  °

## 2022-03-14 ENCOUNTER — Other Ambulatory Visit (HOSPITAL_COMMUNITY): Payer: Self-pay

## 2022-03-14 ENCOUNTER — Telehealth (HOSPITAL_COMMUNITY): Payer: Self-pay

## 2022-03-14 NOTE — Telephone Encounter (Signed)
Pharmacy Transitions of Care Follow-up Telephone Call ? ?Date of discharge: 03/06/22  ?Discharge Diagnosis: Hip replacement surgery ? ?How have you been since you were released from the hospital? Patient doing well since discharge, no questions about meds at this time.  ? ?Medication changes made at discharge: ?    START taking: ?HYDROcodone-acetaminophen (NORCO/VICODIN)  ?methocarbamol (ROBAXIN)  ?traMADol Veatrice Bourbon)  ?Xarelto (rivaroxaban)  ?Icon medications to stop taking   STOP taking: ?CALTRATE 600 PO  ?meclizine 25 MG tablet (ANTIVERT)  ? ?Medication changes verified by the patient? Yes ?  ? ?Medication Accessibility: ? ?Home Pharmacy: Not discussed  ? ?Was the patient provided with refills on discharged medications? No  ? ?Have all prescriptions been transferred from [facility] to home pharmacy? N/A  ? ?Is the patient able to afford medications? Has insurance, no refills needed at this time ?  ? ?Medication Review: ? ?Patient confirmed Xarelto counseling given to patient's son and son has discussed medication with patient. ?RIVAROXABAN (XARELTO)  ?Rivaroxaban 10 mg QD initiated on 03/06/22.  ?- Discussed importance of taking medication with food and around the same time everyday  ?- Advised patient of medications to avoid (NSAIDs, ASA)  ?- Educated that Tylenol (acetaminophen) will be the preferred analgesic to prevent risk of bleeding  ?- Emphasized importance of monitoring for signs and symptoms of bleeding (abnormal bruising, prolonged bleeding, nose bleeds, bleeding from gums, discolored urine, black tarry stools)  ?- Advised patient to alert all providers of anticoagulation therapy prior to starting a new medication or having a procedure  ? ? ?Follow-up Appointments: ? ?PCP Hospital f/u appt confirmed? Sees PCP tomorrow on 03/15/22 ? ?If their condition worsens, is the pt aware to call PCP or go to the Emergency Dept.? Yes ? ?Final Patient Assessment: ?Has f/u scheduled and no refills needed at this time ? ?

## 2022-04-12 ENCOUNTER — Other Ambulatory Visit (HOSPITAL_COMMUNITY): Payer: Medicare Other

## 2022-04-19 ENCOUNTER — Other Ambulatory Visit (HOSPITAL_COMMUNITY): Payer: Self-pay

## 2022-04-23 ENCOUNTER — Other Ambulatory Visit (HOSPITAL_COMMUNITY): Payer: Medicare Other

## 2024-04-28 DIAGNOSIS — Z23 Encounter for immunization: Secondary | ICD-10-CM | POA: Diagnosis not present

## 2024-04-28 DIAGNOSIS — C4490 Unspecified malignant neoplasm of skin, unspecified: Secondary | ICD-10-CM | POA: Diagnosis not present

## 2024-04-28 DIAGNOSIS — I10 Essential (primary) hypertension: Secondary | ICD-10-CM | POA: Diagnosis not present

## 2024-04-28 DIAGNOSIS — E876 Hypokalemia: Secondary | ICD-10-CM | POA: Diagnosis not present

## 2024-04-28 DIAGNOSIS — H8113 Benign paroxysmal vertigo, bilateral: Secondary | ICD-10-CM | POA: Diagnosis not present

## 2024-04-28 DIAGNOSIS — N401 Enlarged prostate with lower urinary tract symptoms: Secondary | ICD-10-CM | POA: Diagnosis not present

## 2024-04-28 DIAGNOSIS — M5459 Other low back pain: Secondary | ICD-10-CM | POA: Diagnosis not present

## 2024-04-28 DIAGNOSIS — M810 Age-related osteoporosis without current pathological fracture: Secondary | ICD-10-CM | POA: Diagnosis not present

## 2024-04-28 DIAGNOSIS — F321 Major depressive disorder, single episode, moderate: Secondary | ICD-10-CM | POA: Diagnosis not present

## 2024-04-28 DIAGNOSIS — R42 Dizziness and giddiness: Secondary | ICD-10-CM | POA: Diagnosis not present

## 2024-04-28 DIAGNOSIS — M25551 Pain in right hip: Secondary | ICD-10-CM | POA: Diagnosis not present

## 2024-04-28 DIAGNOSIS — F4321 Adjustment disorder with depressed mood: Secondary | ICD-10-CM | POA: Diagnosis not present

## 2024-05-06 DIAGNOSIS — H401132 Primary open-angle glaucoma, bilateral, moderate stage: Secondary | ICD-10-CM | POA: Diagnosis not present

## 2024-05-06 DIAGNOSIS — H25813 Combined forms of age-related cataract, bilateral: Secondary | ICD-10-CM | POA: Diagnosis not present

## 2024-05-06 DIAGNOSIS — H5340 Unspecified visual field defects: Secondary | ICD-10-CM | POA: Diagnosis not present

## 2024-05-13 ENCOUNTER — Encounter: Payer: Self-pay | Admitting: Podiatry

## 2024-05-13 ENCOUNTER — Ambulatory Visit (INDEPENDENT_AMBULATORY_CARE_PROVIDER_SITE_OTHER)

## 2024-05-13 ENCOUNTER — Ambulatory Visit: Admitting: Podiatry

## 2024-05-13 VITALS — Ht 68.0 in | Wt 159.0 lb

## 2024-05-13 DIAGNOSIS — M779 Enthesopathy, unspecified: Secondary | ICD-10-CM | POA: Diagnosis not present

## 2024-05-13 DIAGNOSIS — M7751 Other enthesopathy of right foot: Secondary | ICD-10-CM | POA: Diagnosis not present

## 2024-05-13 DIAGNOSIS — M2042 Other hammer toe(s) (acquired), left foot: Secondary | ICD-10-CM

## 2024-05-13 DIAGNOSIS — M2041 Other hammer toe(s) (acquired), right foot: Secondary | ICD-10-CM | POA: Diagnosis not present

## 2024-05-13 MED ORDER — TRIAMCINOLONE ACETONIDE 10 MG/ML IJ SUSP
10.0000 mg | Freq: Once | INTRAMUSCULAR | Status: AC
  Administered 2024-05-13: 10 mg via INTRA_ARTICULAR

## 2024-05-14 NOTE — Progress Notes (Signed)
 Subjective:   Patient ID: Nicholas Stuart, male   DOB: 83 y.o.   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  Patient states it has been there for a while and he gets irritated with shoe gear and there fluid around the area and patient does not smoke and does like to be active   Review of Systems  All other systems reviewed and are negative.       Objective:  Physical Exam Vitals and nursing note reviewed.  Constitutional:      Appearance: He is well-developed.  Pulmonary:     Effort: Pulmonary effort is normal.  Musculoskeletal:        General: Normal range of motion.  Skin:    General: Skin is warm.  Neurological:     Mental Status: He is alert.     Neurovascular status was found to be intact muscle strength was found to be adequate range of motion adequate with patient found to have significant structural bunion deformity right prominence of the bone and what appears to be bone deformity metatarsal cuneiform joint right with inflammation fluid of the tendons within this area.  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  Do not recommend correction of bunion hammertoe unless this were to get worse  X-rays indicate spur formation first metatarsal cuneiform joint significant structural bunion deformity right and hammertoe deformity

## 2024-09-03 DIAGNOSIS — H2513 Age-related nuclear cataract, bilateral: Secondary | ICD-10-CM | POA: Diagnosis not present

## 2024-09-03 DIAGNOSIS — H401132 Primary open-angle glaucoma, bilateral, moderate stage: Secondary | ICD-10-CM | POA: Diagnosis not present

## 2024-10-22 DIAGNOSIS — I10 Essential (primary) hypertension: Secondary | ICD-10-CM | POA: Diagnosis not present

## 2024-10-29 DIAGNOSIS — C4490 Unspecified malignant neoplasm of skin, unspecified: Secondary | ICD-10-CM | POA: Diagnosis not present

## 2024-10-29 DIAGNOSIS — H8113 Benign paroxysmal vertigo, bilateral: Secondary | ICD-10-CM | POA: Diagnosis not present

## 2024-10-29 DIAGNOSIS — Z23 Encounter for immunization: Secondary | ICD-10-CM | POA: Diagnosis not present

## 2024-10-29 DIAGNOSIS — Z1331 Encounter for screening for depression: Secondary | ICD-10-CM | POA: Diagnosis not present

## 2024-10-29 DIAGNOSIS — R82998 Other abnormal findings in urine: Secondary | ICD-10-CM | POA: Diagnosis not present

## 2024-10-29 DIAGNOSIS — I10 Essential (primary) hypertension: Secondary | ICD-10-CM | POA: Diagnosis not present

## 2024-10-29 DIAGNOSIS — F321 Major depressive disorder, single episode, moderate: Secondary | ICD-10-CM | POA: Diagnosis not present

## 2024-10-29 DIAGNOSIS — N401 Enlarged prostate with lower urinary tract symptoms: Secondary | ICD-10-CM | POA: Diagnosis not present

## 2024-10-29 DIAGNOSIS — M810 Age-related osteoporosis without current pathological fracture: Secondary | ICD-10-CM | POA: Diagnosis not present

## 2024-10-29 DIAGNOSIS — F4321 Adjustment disorder with depressed mood: Secondary | ICD-10-CM | POA: Diagnosis not present
